# Patient Record
Sex: Female | Born: 1960
Health system: Southern US, Community
[De-identification: ages and names within clinical notes are randomized; demographics above are authoritative.]

## PROBLEM LIST (undated history)

## (undated) DIAGNOSIS — E785 Hyperlipidemia, unspecified: Secondary | ICD-10-CM

## (undated) DIAGNOSIS — S46009A Unspecified injury of muscle(s) and tendon(s) of the rotator cuff of unspecified shoulder, initial encounter: Secondary | ICD-10-CM

## (undated) DIAGNOSIS — F329 Major depressive disorder, single episode, unspecified: Secondary | ICD-10-CM

## (undated) DIAGNOSIS — Z86010 Personal history of colonic polyps: Secondary | ICD-10-CM

## (undated) DIAGNOSIS — E119 Type 2 diabetes mellitus without complications: Secondary | ICD-10-CM

## (undated) DIAGNOSIS — M1990 Unspecified osteoarthritis, unspecified site: Secondary | ICD-10-CM

## (undated) DIAGNOSIS — I10 Essential (primary) hypertension: Secondary | ICD-10-CM

## (undated) DIAGNOSIS — F32A Depression, unspecified: Secondary | ICD-10-CM

## (undated) HISTORY — PX: TOTAL ABDOMINAL HYSTERECTOMY: SHX209

## (undated) HISTORY — PX: COLPOSCOPY: SHX161

## (undated) HISTORY — DX: Hyperlipidemia, unspecified: E78.5

## (undated) HISTORY — DX: Major depressive disorder, single episode, unspecified: F32.9

## (undated) HISTORY — DX: Depression, unspecified: F32.A

## (undated) HISTORY — DX: Unspecified osteoarthritis, unspecified site: M19.90

## (undated) HISTORY — DX: Type 2 diabetes mellitus without complications: E11.9

## (undated) HISTORY — DX: Personal history of colonic polyps: Z86.010

## (undated) HISTORY — DX: Unspecified injury of muscle(s) and tendon(s) of the rotator cuff of unspecified shoulder, initial encounter: S46.009A

## (undated) HISTORY — DX: Essential (primary) hypertension: I10

---

## 1987-07-21 HISTORY — PX: NASAL SEPTUM SURGERY: SHX37

## 1999-05-13 ENCOUNTER — Other Ambulatory Visit: Admission: RE | Admit: 1999-05-13 | Discharge: 1999-05-13 | Payer: Self-pay | Admitting: Obstetrics & Gynecology

## 1999-05-13 ENCOUNTER — Encounter: Admission: RE | Admit: 1999-05-13 | Discharge: 1999-05-13 | Payer: Self-pay | Admitting: Internal Medicine

## 1999-06-27 ENCOUNTER — Encounter: Admission: RE | Admit: 1999-06-27 | Discharge: 1999-06-27 | Payer: Self-pay | Admitting: Obstetrics & Gynecology

## 1999-07-07 ENCOUNTER — Encounter: Payer: Self-pay | Admitting: Emergency Medicine

## 1999-07-07 ENCOUNTER — Emergency Department (HOSPITAL_COMMUNITY): Admission: EM | Admit: 1999-07-07 | Discharge: 1999-07-07 | Payer: Self-pay | Admitting: Emergency Medicine

## 2003-02-02 ENCOUNTER — Encounter: Admission: RE | Admit: 2003-02-02 | Discharge: 2003-02-02 | Payer: Self-pay | Admitting: Internal Medicine

## 2003-05-11 ENCOUNTER — Encounter (INDEPENDENT_AMBULATORY_CARE_PROVIDER_SITE_OTHER): Payer: Self-pay | Admitting: *Deleted

## 2003-05-11 ENCOUNTER — Encounter: Admission: RE | Admit: 2003-05-11 | Discharge: 2003-05-11 | Payer: Self-pay | Admitting: Obstetrics and Gynecology

## 2003-05-17 ENCOUNTER — Other Ambulatory Visit: Admission: RE | Admit: 2003-05-17 | Discharge: 2003-05-17 | Payer: Self-pay | Admitting: Family Medicine

## 2003-05-17 ENCOUNTER — Encounter (INDEPENDENT_AMBULATORY_CARE_PROVIDER_SITE_OTHER): Payer: Self-pay | Admitting: *Deleted

## 2003-05-17 ENCOUNTER — Encounter: Admission: RE | Admit: 2003-05-17 | Discharge: 2003-05-17 | Payer: Self-pay | Admitting: Obstetrics and Gynecology

## 2003-05-21 ENCOUNTER — Ambulatory Visit (HOSPITAL_COMMUNITY): Admission: RE | Admit: 2003-05-21 | Discharge: 2003-05-21 | Payer: Self-pay | Admitting: Obstetrics and Gynecology

## 2003-05-23 ENCOUNTER — Ambulatory Visit (HOSPITAL_COMMUNITY): Admission: RE | Admit: 2003-05-23 | Discharge: 2003-05-23 | Payer: Self-pay | Admitting: *Deleted

## 2003-05-28 ENCOUNTER — Ambulatory Visit (HOSPITAL_COMMUNITY): Admission: RE | Admit: 2003-05-28 | Discharge: 2003-05-28 | Payer: Self-pay | Admitting: Gynecology

## 2003-05-29 ENCOUNTER — Ambulatory Visit: Admission: RE | Admit: 2003-05-29 | Discharge: 2003-05-29 | Payer: Self-pay | Admitting: Gynecology

## 2003-06-08 ENCOUNTER — Encounter (INDEPENDENT_AMBULATORY_CARE_PROVIDER_SITE_OTHER): Payer: Self-pay

## 2003-06-08 ENCOUNTER — Ambulatory Visit (HOSPITAL_COMMUNITY): Admission: RE | Admit: 2003-06-08 | Discharge: 2003-06-08 | Payer: Self-pay | Admitting: Family Medicine

## 2003-06-22 ENCOUNTER — Encounter: Admission: RE | Admit: 2003-06-22 | Discharge: 2003-06-22 | Payer: Self-pay | Admitting: Family Medicine

## 2003-07-12 ENCOUNTER — Encounter: Admission: RE | Admit: 2003-07-12 | Discharge: 2003-07-12 | Payer: Self-pay | Admitting: Obstetrics and Gynecology

## 2003-08-03 ENCOUNTER — Ambulatory Visit: Admission: RE | Admit: 2003-08-03 | Discharge: 2003-08-03 | Payer: Self-pay | Admitting: Family Medicine

## 2003-08-22 ENCOUNTER — Inpatient Hospital Stay (HOSPITAL_COMMUNITY): Admission: RE | Admit: 2003-08-22 | Discharge: 2003-08-26 | Payer: Self-pay | Admitting: Family Medicine

## 2003-08-22 ENCOUNTER — Encounter (INDEPENDENT_AMBULATORY_CARE_PROVIDER_SITE_OTHER): Payer: Self-pay | Admitting: Specialist

## 2003-08-28 ENCOUNTER — Encounter: Admission: RE | Admit: 2003-08-28 | Discharge: 2003-08-28 | Payer: Self-pay | Admitting: Obstetrics and Gynecology

## 2003-09-06 ENCOUNTER — Encounter: Admission: RE | Admit: 2003-09-06 | Discharge: 2003-09-06 | Payer: Self-pay | Admitting: Family Medicine

## 2003-09-13 ENCOUNTER — Encounter: Admission: RE | Admit: 2003-09-13 | Discharge: 2003-09-13 | Payer: Self-pay | Admitting: Family Medicine

## 2003-09-20 ENCOUNTER — Encounter: Admission: RE | Admit: 2003-09-20 | Discharge: 2003-09-20 | Payer: Self-pay | Admitting: Obstetrics and Gynecology

## 2003-09-27 ENCOUNTER — Encounter: Admission: RE | Admit: 2003-09-27 | Discharge: 2003-09-27 | Payer: Self-pay | Admitting: Obstetrics and Gynecology

## 2003-10-04 ENCOUNTER — Encounter: Admission: RE | Admit: 2003-10-04 | Discharge: 2003-10-04 | Payer: Self-pay | Admitting: Family Medicine

## 2003-10-18 ENCOUNTER — Encounter: Admission: RE | Admit: 2003-10-18 | Discharge: 2003-10-18 | Payer: Self-pay | Admitting: Obstetrics and Gynecology

## 2004-05-15 ENCOUNTER — Ambulatory Visit (HOSPITAL_COMMUNITY): Admission: RE | Admit: 2004-05-15 | Discharge: 2004-05-15 | Payer: Self-pay | Admitting: Obstetrics and Gynecology

## 2004-05-15 ENCOUNTER — Ambulatory Visit: Payer: Self-pay | Admitting: Obstetrics and Gynecology

## 2004-05-30 ENCOUNTER — Encounter: Admission: RE | Admit: 2004-05-30 | Discharge: 2004-05-30 | Payer: Self-pay | Admitting: Family Medicine

## 2004-10-23 ENCOUNTER — Ambulatory Visit: Payer: Self-pay | Admitting: Family Medicine

## 2005-07-23 ENCOUNTER — Encounter: Admission: RE | Admit: 2005-07-23 | Discharge: 2005-07-23 | Payer: Self-pay | Admitting: Family Medicine

## 2005-12-23 ENCOUNTER — Encounter: Payer: Self-pay | Admitting: Obstetrics & Gynecology

## 2005-12-23 ENCOUNTER — Ambulatory Visit: Payer: Self-pay | Admitting: Family Medicine

## 2007-01-05 ENCOUNTER — Encounter: Payer: Self-pay | Admitting: Obstetrics & Gynecology

## 2007-01-05 ENCOUNTER — Ambulatory Visit: Payer: Self-pay | Admitting: Obstetrics & Gynecology

## 2007-01-05 ENCOUNTER — Encounter: Admission: RE | Admit: 2007-01-05 | Discharge: 2007-01-05 | Payer: Self-pay | Admitting: Family Medicine

## 2007-05-11 ENCOUNTER — Ambulatory Visit: Payer: Self-pay | Admitting: Family Medicine

## 2007-05-11 DIAGNOSIS — F3289 Other specified depressive episodes: Secondary | ICD-10-CM | POA: Insufficient documentation

## 2007-05-11 DIAGNOSIS — E785 Hyperlipidemia, unspecified: Secondary | ICD-10-CM | POA: Insufficient documentation

## 2007-05-11 DIAGNOSIS — F329 Major depressive disorder, single episode, unspecified: Secondary | ICD-10-CM | POA: Insufficient documentation

## 2007-05-11 DIAGNOSIS — I10 Essential (primary) hypertension: Secondary | ICD-10-CM | POA: Insufficient documentation

## 2007-05-23 ENCOUNTER — Ambulatory Visit: Payer: Self-pay | Admitting: Family Medicine

## 2007-05-23 LAB — CONVERTED CEMR LAB
Bilirubin Urine: NEGATIVE
Blood in Urine, dipstick: NEGATIVE
Glucose, Urine, Semiquant: NEGATIVE
Ketones, urine, test strip: NEGATIVE
Nitrite: NEGATIVE
Protein, U semiquant: NEGATIVE
Specific Gravity, Urine: 1.02
Urobilinogen, UA: 0.2
WBC Urine, dipstick: NEGATIVE
pH: 6

## 2007-05-25 LAB — CONVERTED CEMR LAB
ALT: 33 units/L (ref 0–35)
AST: 24 units/L (ref 0–37)
Albumin: 3.8 g/dL (ref 3.5–5.2)
Alkaline Phosphatase: 55 units/L (ref 39–117)
BUN: 8 mg/dL (ref 6–23)
Basophils Absolute: 0.1 10*3/uL (ref 0.0–0.1)
Basophils Relative: 0.9 % (ref 0.0–1.0)
Bilirubin, Direct: 0.1 mg/dL (ref 0.0–0.3)
CO2: 25 meq/L (ref 19–32)
Calcium: 9.4 mg/dL (ref 8.4–10.5)
Chloride: 110 meq/L (ref 96–112)
Cholesterol: 276 mg/dL (ref 0–200)
Creatinine, Ser: 0.7 mg/dL (ref 0.4–1.2)
Direct LDL: 205.2 mg/dL
Eosinophils Absolute: 0.2 10*3/uL (ref 0.0–0.6)
Eosinophils Relative: 3.3 % (ref 0.0–5.0)
GFR calc Af Amer: 115 mL/min
GFR calc non Af Amer: 95 mL/min
Glucose, Bld: 98 mg/dL (ref 70–99)
HCT: 41.3 % (ref 36.0–46.0)
HDL: 39.4 mg/dL (ref >39.0)
Hemoglobin: 14.8 g/dL (ref 12.0–15.0)
Lymphocytes Relative: 33.8 % (ref 12.0–46.0)
MCHC: 35.8 g/dL (ref 30.0–36.0)
MCV: 88.5 fL (ref 78.0–100.0)
Monocytes Absolute: 0.4 10*3/uL (ref 0.2–0.7)
Monocytes Relative: 6.2 % (ref 3.0–11.0)
Neutro Abs: 4 10*3/uL (ref 1.4–7.7)
Neutrophils Relative %: 55.8 % (ref 43.0–77.0)
Platelets: 275 10*3/uL (ref 150–400)
Potassium: 4.3 meq/L (ref 3.5–5.1)
RBC: 4.67 M/uL (ref 3.87–5.11)
RDW: 12.2 % (ref 11.5–14.6)
Sodium: 143 meq/L (ref 135–145)
TSH: 2.24 microintl units/mL (ref 0.35–5.50)
Total Bilirubin: 0.7 mg/dL (ref 0.3–1.2)
Total CHOL/HDL Ratio: 7
Total Protein: 6.6 g/dL (ref 6.0–8.3)
Triglycerides: 228 mg/dL (ref 0–149)
VLDL: 46 mg/dL — ABNORMAL HIGH (ref 0–40)
WBC: 7.1 10*3/uL (ref 4.5–10.5)

## 2007-05-27 ENCOUNTER — Telehealth: Payer: Self-pay | Admitting: Family Medicine

## 2007-08-12 ENCOUNTER — Ambulatory Visit: Payer: Self-pay | Admitting: Family Medicine

## 2007-08-12 ENCOUNTER — Telehealth: Payer: Self-pay | Admitting: Family Medicine

## 2007-08-15 ENCOUNTER — Telehealth: Payer: Self-pay | Admitting: Family Medicine

## 2007-08-15 LAB — CONVERTED CEMR LAB
ALT: 36 units/L — ABNORMAL HIGH (ref 0–35)
AST: 24 units/L (ref 0–37)
Albumin: 3.9 g/dL (ref 3.5–5.2)
Alkaline Phosphatase: 57 units/L (ref 39–117)
Bilirubin, Direct: 0.1 mg/dL (ref 0.0–0.3)
Cholesterol: 177 mg/dL (ref 0–200)
HDL: 41.2 mg/dL (ref >39.0)
LDL Cholesterol: 114 mg/dL — ABNORMAL HIGH (ref 0–99)
Total Bilirubin: 0.8 mg/dL (ref 0.3–1.2)
Total CHOL/HDL Ratio: 4.3
Total Protein: 7.1 g/dL (ref 6.0–8.3)
Triglycerides: 111 mg/dL (ref 0–149)
VLDL: 22 mg/dL (ref 0–40)

## 2007-10-12 ENCOUNTER — Telehealth: Payer: Self-pay | Admitting: Family Medicine

## 2007-11-30 ENCOUNTER — Telehealth: Payer: Self-pay | Admitting: Family Medicine

## 2007-12-15 ENCOUNTER — Encounter: Payer: Self-pay | Admitting: Family Medicine

## 2007-12-21 ENCOUNTER — Encounter: Admission: RE | Admit: 2007-12-21 | Discharge: 2007-12-21 | Payer: Self-pay | Admitting: Family Medicine

## 2008-03-13 ENCOUNTER — Encounter: Payer: Self-pay | Admitting: Family Medicine

## 2008-03-29 ENCOUNTER — Telehealth: Payer: Self-pay | Admitting: Family Medicine

## 2008-04-23 ENCOUNTER — Ambulatory Visit: Payer: Self-pay | Admitting: Family Medicine

## 2008-04-23 ENCOUNTER — Encounter: Admission: RE | Admit: 2008-04-23 | Discharge: 2008-04-23 | Payer: Self-pay | Admitting: Family Medicine

## 2008-05-09 ENCOUNTER — Encounter: Payer: Self-pay | Admitting: Family Medicine

## 2008-05-23 ENCOUNTER — Encounter: Payer: Self-pay | Admitting: Family Medicine

## 2008-05-30 ENCOUNTER — Encounter: Payer: Self-pay | Admitting: Family Medicine

## 2008-06-15 ENCOUNTER — Telehealth: Payer: Self-pay | Admitting: Family Medicine

## 2008-10-05 ENCOUNTER — Telehealth: Payer: Self-pay | Admitting: Family Medicine

## 2009-05-06 ENCOUNTER — Telehealth: Payer: Self-pay | Admitting: Family Medicine

## 2009-05-10 ENCOUNTER — Ambulatory Visit: Payer: Self-pay | Admitting: Family Medicine

## 2009-05-10 DIAGNOSIS — M1990 Unspecified osteoarthritis, unspecified site: Secondary | ICD-10-CM | POA: Insufficient documentation

## 2009-05-10 DIAGNOSIS — J309 Allergic rhinitis, unspecified: Secondary | ICD-10-CM | POA: Insufficient documentation

## 2009-05-10 LAB — CONVERTED CEMR LAB
Bilirubin Urine: NEGATIVE
Blood in Urine, dipstick: NEGATIVE
Glucose, Urine, Semiquant: NEGATIVE
Ketones, urine, test strip: NEGATIVE
Nitrite: POSITIVE
Protein, U semiquant: NEGATIVE
Specific Gravity, Urine: 1.02
Urobilinogen, UA: 0.2
WBC Urine, dipstick: NEGATIVE
pH: 5

## 2009-05-11 ENCOUNTER — Encounter: Payer: Self-pay | Admitting: Family Medicine

## 2009-05-13 LAB — CONVERTED CEMR LAB
ALT: 69 units/L — ABNORMAL HIGH (ref 0–35)
AST: 46 units/L — ABNORMAL HIGH (ref 0–37)
Albumin: 3.9 g/dL (ref 3.5–5.2)
Alkaline Phosphatase: 57 units/L (ref 39–117)
BUN: 9 mg/dL (ref 6–23)
Basophils Absolute: 0.1 10*3/uL (ref 0.0–0.1)
Basophils Relative: 0.8 % (ref 0.0–3.0)
Bilirubin, Direct: 0.1 mg/dL (ref 0.0–0.3)
CO2: 28 meq/L (ref 19–32)
Calcium: 8.9 mg/dL (ref 8.4–10.5)
Chloride: 106 meq/L (ref 96–112)
Cholesterol: 193 mg/dL (ref 0–200)
Creatinine, Ser: 0.7 mg/dL (ref 0.4–1.2)
Eosinophils Absolute: 0.2 10*3/uL (ref 0.0–0.7)
Eosinophils Relative: 3.2 % (ref 0.0–5.0)
GFR calc non Af Amer: 94.01 mL/min (ref >60)
Glucose, Bld: 93 mg/dL (ref 70–99)
HCT: 41.5 % (ref 36.0–46.0)
HDL: 40 mg/dL (ref >39.00)
Hemoglobin: 14.5 g/dL (ref 12.0–15.0)
LDL Cholesterol: 126 mg/dL — ABNORMAL HIGH (ref 0–99)
Lymphocytes Relative: 31 % (ref 12.0–46.0)
Lymphs Abs: 2 10*3/uL (ref 0.7–4.0)
MCHC: 35 g/dL (ref 30.0–36.0)
MCV: 91.3 fL (ref 78.0–100.0)
Monocytes Absolute: 0.5 10*3/uL (ref 0.1–1.0)
Monocytes Relative: 7.4 % (ref 3.0–12.0)
Neutro Abs: 3.7 10*3/uL (ref 1.4–7.7)
Neutrophils Relative %: 57.6 % (ref 43.0–77.0)
Platelets: 203 10*3/uL (ref 150.0–400.0)
Potassium: 3.8 meq/L (ref 3.5–5.1)
RBC: 4.55 M/uL (ref 3.87–5.11)
RDW: 12.5 % (ref 11.5–14.6)
Sodium: 144 meq/L (ref 135–145)
TSH: 1.74 microintl units/mL (ref 0.35–5.50)
Total Bilirubin: 0.9 mg/dL (ref 0.3–1.2)
Total CHOL/HDL Ratio: 5
Total Protein: 7 g/dL (ref 6.0–8.3)
Triglycerides: 133 mg/dL (ref 0.0–149.0)
VLDL: 26.6 mg/dL (ref 0.0–40.0)
WBC: 6.5 10*3/uL (ref 4.5–10.5)

## 2009-06-12 ENCOUNTER — Telehealth: Payer: Self-pay | Admitting: Family Medicine

## 2009-12-06 ENCOUNTER — Telehealth: Payer: Self-pay | Admitting: Family Medicine

## 2010-04-11 ENCOUNTER — Telehealth: Payer: Self-pay | Admitting: Family Medicine

## 2010-08-17 LAB — CONVERTED CEMR LAB
ALT: 37 units/L — ABNORMAL HIGH (ref 0–35)
AST: 28 units/L (ref 0–37)
Albumin: 3.6 g/dL (ref 3.5–5.2)
Alkaline Phosphatase: 58 units/L (ref 39–117)
BUN: 11 mg/dL (ref 6–23)
Basophils Absolute: 0.1 10*3/uL (ref 0.0–0.1)
Basophils Relative: 0.8 % (ref 0.0–3.0)
Bilirubin Urine: NEGATIVE
Bilirubin, Direct: 0.1 mg/dL (ref 0.0–0.3)
Blood in Urine, dipstick: NEGATIVE
CO2: 25 meq/L (ref 19–32)
Calcium: 8.7 mg/dL (ref 8.4–10.5)
Chloride: 108 meq/L (ref 96–112)
Cholesterol: 194 mg/dL (ref 0–200)
Creatinine, Ser: 0.6 mg/dL (ref 0.4–1.2)
Eosinophils Absolute: 0.2 10*3/uL (ref 0.0–0.7)
Eosinophils Relative: 2.7 % (ref 0.0–5.0)
GFR calc Af Amer: 137 mL/min
GFR calc non Af Amer: 113 mL/min
Glucose, Bld: 106 mg/dL — ABNORMAL HIGH (ref 70–99)
Glucose, Urine, Semiquant: NEGATIVE
HCT: 39.8 % (ref 36.0–46.0)
HDL: 41.7 mg/dL (ref >39.0)
Hemoglobin: 13.8 g/dL (ref 12.0–15.0)
Ketones, urine, test strip: NEGATIVE
LDL Cholesterol: 131 mg/dL — ABNORMAL HIGH (ref 0–99)
Lymphocytes Relative: 28.2 % (ref 12.0–46.0)
MCHC: 34.6 g/dL (ref 30.0–36.0)
MCV: 90.1 fL (ref 78.0–100.0)
Monocytes Absolute: 0.4 10*3/uL (ref 0.1–1.0)
Monocytes Relative: 6.5 % (ref 3.0–12.0)
Neutro Abs: 4.2 10*3/uL (ref 1.4–7.7)
Neutrophils Relative %: 61.8 % (ref 43.0–77.0)
Nitrite: NEGATIVE
Platelets: 227 10*3/uL (ref 150–400)
Potassium: 3.7 meq/L (ref 3.5–5.1)
Protein, U semiquant: NEGATIVE
RBC: 4.42 M/uL (ref 3.87–5.11)
RDW: 12.4 % (ref 11.5–14.6)
Sodium: 142 meq/L (ref 135–145)
Specific Gravity, Urine: 1.025
TSH: 1.41 microintl units/mL (ref 0.35–5.50)
Total Bilirubin: 0.7 mg/dL (ref 0.3–1.2)
Total CHOL/HDL Ratio: 4.7
Total Protein: 6.4 g/dL (ref 6.0–8.3)
Triglycerides: 105 mg/dL (ref 0–149)
Urobilinogen, UA: 0.2
VLDL: 21 mg/dL (ref 0–40)
WBC Urine, dipstick: NEGATIVE
WBC: 6.8 10*3/uL (ref 4.5–10.5)
pH: 6

## 2010-08-21 ENCOUNTER — Other Ambulatory Visit: Payer: Self-pay

## 2010-08-21 DIAGNOSIS — I10 Essential (primary) hypertension: Secondary | ICD-10-CM

## 2010-08-21 DIAGNOSIS — E785 Hyperlipidemia, unspecified: Secondary | ICD-10-CM

## 2010-08-21 NOTE — Progress Notes (Signed)
Summary: Ibuprofen  Phone Note Call from Patient   Caller: Patient Call For: Nelwyn Salisbury MD Summary of Call: Pt. takes Ibuprofen 200 mg 4 as needed, but took a 800 mg. from a friend, and worked much better.  Would like a prescription sent to [facility]  [facility]......[employer] [phone] Initial call taken by: Lynann Beaver CMA,  Dec 06, 2009 1:45 PM  Follow-up for Phone Call        call in Motrin 800 mg q 6 hours as needed pain, #60 with 5 rf Follow-up by: Nelwyn Salisbury MD,  Dec 06, 2009 3:09 PM    New/Updated Medications: IBUPROFEN 800 MG TABS (IBUPROFEN) one by mouth q 6-8 hrs as needed pain Prescriptions: IBUPROFEN 800 MG TABS (IBUPROFEN) one by mouth q 6-8 hrs as needed pain  #60 x 5   Entered by:   Lynann Beaver CMA   Authorized by:   Nelwyn Salisbury MD   Signed by:   Lynann Beaver CMA on 12/06/2009   Method used:   Electronically to        [facility] [address] [phone]* (retail)       [address]       [place], [place]  [address]       Ph: [phone]       Fax: [phone]   RxID:   [id number]

## 2010-08-21 NOTE — Progress Notes (Signed)
Summary: skin tags  Phone Note Call from Patient Call back at Home Phone [phone]   Caller: Patient Call For: Nelwyn Salisbury MD Summary of Call: Pt wants to know if there is a RX to destroy skin tags. Initial call taken by: Lynann Beaver CMA,  April 11, 2010 3:20 PM  Follow-up for Phone Call        sometimes this works for very small tags but not larger ones. I would need to see her to evaluate these  Follow-up by: Nelwyn Salisbury MD,  April 11, 2010 3:33 PM  Additional Follow-up for Phone Call Additional follow up Details #1::        Notified pt. Additional Follow-up by: Lynann Beaver CMA,  April 11, 2010 3:36 PM

## 2010-08-22 MED ORDER — LISINOPRIL-HYDROCHLOROTHIAZIDE 10-12.5 MG PO TABS: 1.0000 | ORAL_TABLET | Freq: Every day | ORAL | Status: DC

## 2010-08-22 MED ORDER — SIMVASTATIN 40 MG PO TABS: 40.0000 mg | ORAL_TABLET | Freq: Every evening | ORAL | Status: DC

## 2010-08-22 NOTE — Telephone Encounter (Signed)
Call in one month of each with no rf. Needs an OV soon

## 2010-08-29 ENCOUNTER — Telehealth: Payer: Self-pay | Admitting: *Deleted

## 2010-08-29 NOTE — Telephone Encounter (Signed)
Faxed refill request came to Dr Caryl Never, it's a Dr Clent Ridges pt.  Routing to [place], faxed request in Gina's basket

## 2010-09-01 ENCOUNTER — Other Ambulatory Visit: Payer: Self-pay | Admitting: Family Medicine

## 2010-09-01 NOTE — Telephone Encounter (Signed)
Teresa Sine,  Do you know what the medication is ?

## 2010-09-01 NOTE — Telephone Encounter (Signed)
Left mess on vm that appt needed before refill

## 2010-09-29 ENCOUNTER — Encounter: Payer: Self-pay | Admitting: Family Medicine

## 2010-09-30 ENCOUNTER — Encounter: Payer: Self-pay | Admitting: Family Medicine

## 2010-09-30 ENCOUNTER — Ambulatory Visit (INDEPENDENT_AMBULATORY_CARE_PROVIDER_SITE_OTHER): Payer: Self-pay | Admitting: Family Medicine

## 2010-09-30 VITALS — BP 120/80 | HR 116 | Temp 98.4°F

## 2010-09-30 DIAGNOSIS — F329 Major depressive disorder, single episode, unspecified: Secondary | ICD-10-CM

## 2010-09-30 DIAGNOSIS — I10 Essential (primary) hypertension: Secondary | ICD-10-CM

## 2010-09-30 DIAGNOSIS — F32A Depression, unspecified: Secondary | ICD-10-CM

## 2010-09-30 DIAGNOSIS — E785 Hyperlipidemia, unspecified: Secondary | ICD-10-CM

## 2010-09-30 MED ORDER — SIMVASTATIN 40 MG PO TABS: 40.0000 mg | ORAL_TABLET | Freq: Every day | ORAL | Status: DC

## 2010-09-30 MED ORDER — LISINOPRIL-HYDROCHLOROTHIAZIDE 10-12.5 MG PO TABS: 1.0000 | ORAL_TABLET | Freq: Every day | ORAL | Status: DC

## 2010-09-30 MED ORDER — IBUPROFEN 800 MG PO TABS: 800.0000 mg | ORAL_TABLET | Freq: Four times a day (QID) | ORAL | Status: DC | PRN

## 2010-09-30 NOTE — Progress Notes (Signed)
  Subjective:    Patient ID: Teresa Dawson, female    DOB: 03-Jun-1961, 50 y.o.   MRN: [id number]  HPI Here to follow up on depression, lipids, and HTN. She feels well and has no concerns. She is not fasting today.She has not had a cpx for several years.   Review of Systems  Constitutional: Negative.   Respiratory: Negative.   Cardiovascular: Negative.   Psychiatric/Behavioral: Negative.        Objective:   Physical Exam  Constitutional: She appears well-developed and well-nourished.  Neck: No thyromegaly present.  Cardiovascular: Normal rate, regular rhythm, normal heart sounds and intact distal pulses.   Pulmonary/Chest: Effort normal and breath sounds normal.  Lymphadenopathy:    She has no cervical adenopathy.  Psychiatric: She has a normal mood and affect. Her behavior is normal. Judgment and thought content normal.          Assessment & Plan:  She seems to be doing well in general. I refilled her meds. She will set up fasting labs and a cpx soon.

## 2010-11-04 ENCOUNTER — Other Ambulatory Visit (INDEPENDENT_AMBULATORY_CARE_PROVIDER_SITE_OTHER): Payer: Self-pay | Admitting: Family Medicine

## 2010-11-04 DIAGNOSIS — Z0000 Encounter for general adult medical examination without abnormal findings: Secondary | ICD-10-CM

## 2010-11-04 LAB — BASIC METABOLIC PANEL
BUN: 13 mg/dL (ref 6–23)
CO2: 24 mEq/L (ref 19–32)
Calcium: 9.4 mg/dL (ref 8.4–10.5)
Chloride: 105 mEq/L (ref 96–112)
Creatinine, Ser: 0.7 mg/dL (ref 0.4–1.2)
GFR: 90.46 mL/min (ref >60.00)
Glucose, Bld: 107 mg/dL — ABNORMAL HIGH (ref 70–99)
Potassium: 4 mEq/L (ref 3.5–5.1)
Sodium: 139 mEq/L (ref 135–145)

## 2010-11-04 LAB — POCT URINALYSIS DIPSTICK
Bilirubin, UA: NEGATIVE
Blood, UA: NEGATIVE
Ketones, UA: NEGATIVE
Leukocytes, UA: NEGATIVE
pH, UA: 5

## 2010-11-04 LAB — CBC WITH DIFFERENTIAL/PLATELET
Basophils Absolute: 0 10*3/uL (ref 0.0–0.1)
Eosinophils Absolute: 0.2 10*3/uL (ref 0.0–0.7)
HCT: 40.3 % (ref 36.0–46.0)
Hemoglobin: 14 g/dL (ref 12.0–15.0)
Lymphs Abs: 2.2 10*3/uL (ref 0.7–4.0)
MCHC: 34.8 g/dL (ref 30.0–36.0)
Monocytes Absolute: 0.5 10*3/uL (ref 0.1–1.0)
Neutro Abs: 4.6 10*3/uL (ref 1.4–7.7)
RDW: 13.1 % (ref 11.5–14.6)

## 2010-11-04 LAB — LIPID PANEL
Cholesterol: 181 mg/dL (ref 0–200)
HDL: 41 mg/dL (ref >39.00)
VLDL: 22.4 mg/dL (ref 0.0–40.0)

## 2010-11-04 LAB — HEPATIC FUNCTION PANEL: Albumin: 3.8 g/dL (ref 3.5–5.2)

## 2010-11-06 ENCOUNTER — Telehealth: Payer: Self-pay

## 2010-11-06 NOTE — Telephone Encounter (Signed)
Pt aware.

## 2010-11-06 NOTE — Telephone Encounter (Signed)
Message copied by Madison Hickman on Thu Nov 06, 2010 10:11 AM ------      Message from: Dwaine Deter      Created: Thu Nov 06, 2010  8:19 AM       normal

## 2010-11-07 ENCOUNTER — Other Ambulatory Visit: Payer: Self-pay | Admitting: Family Medicine

## 2010-11-07 DIAGNOSIS — Z1231 Encounter for screening mammogram for malignant neoplasm of breast: Secondary | ICD-10-CM

## 2010-11-11 ENCOUNTER — Encounter: Payer: Self-pay | Admitting: Family Medicine

## 2010-11-11 ENCOUNTER — Ambulatory Visit
Admission: RE | Admit: 2010-11-11 | Discharge: 2010-11-11 | Disposition: A | Discharge status: Home / Self Care | Payer: Self-pay | Source: Ambulatory Visit | Attending: Family Medicine | Admitting: Family Medicine

## 2010-11-11 DIAGNOSIS — Z1231 Encounter for screening mammogram for malignant neoplasm of breast: Secondary | ICD-10-CM

## 2010-11-18 ENCOUNTER — Encounter: Payer: Self-pay | Admitting: Family Medicine

## 2010-11-18 ENCOUNTER — Ambulatory Visit: Payer: Self-pay | Admitting: Family Medicine

## 2010-11-18 ENCOUNTER — Ambulatory Visit (INDEPENDENT_AMBULATORY_CARE_PROVIDER_SITE_OTHER): Payer: Self-pay | Admitting: Family Medicine

## 2010-11-18 VITALS — BP 132/78 | HR 107 | Temp 98.2°F | Resp 18 | Ht 63.5 in | Wt 260.0 lb

## 2010-11-18 DIAGNOSIS — Z136 Encounter for screening for cardiovascular disorders: Secondary | ICD-10-CM

## 2010-11-18 DIAGNOSIS — Z0000 Encounter for general adult medical examination without abnormal findings: Secondary | ICD-10-CM

## 2010-11-18 MED ORDER — FLUTICASONE PROPIONATE 50 MCG/ACT NA SUSP: 2.0000 | Freq: Every day | NASAL | Status: DC

## 2010-11-18 NOTE — Progress Notes (Signed)
  Subjective:    Patient ID: Teresa Dawson, female    DOB: Dec 01, 1960, 50 y.o.   MRN: [id number]  HPI 50 yr old female for a cpx. She feels well. She knows she is overweight and gets very little exercise.    Review of Systems  Constitutional: Negative.   HENT: Negative.   Eyes: Negative.   Respiratory: Negative.   Cardiovascular: Negative.   Gastrointestinal: Negative.   Genitourinary: Negative for dysuria, urgency, frequency, hematuria, flank pain, decreased urine volume, enuresis, difficulty urinating, pelvic pain and dyspareunia.  Musculoskeletal: Negative.   Skin: Negative.   Neurological: Negative.   Hematological: Negative.   Psychiatric/Behavioral: Negative.        Objective:   Physical Exam  Constitutional: She is oriented to person, place, and time. She appears well-developed and well-nourished. No distress.  HENT:  Head: Normocephalic and atraumatic.  Right Ear: External ear normal.  Left Ear: External ear normal.  Nose: Nose normal.  Mouth/Throat: Oropharynx is clear and moist. No oropharyngeal exudate.  Eyes: Conjunctivae and EOM are normal. Pupils are equal, round, and reactive to light. No scleral icterus.  Neck: Normal range of motion. Neck supple. No JVD present. No thyromegaly present.  Cardiovascular: Normal rate, regular rhythm, normal heart sounds and intact distal pulses.  Exam reveals no gallop and no friction rub.   No murmur heard.      EKG normal   Pulmonary/Chest: Effort normal and breath sounds normal. No respiratory distress. She has no wheezes. She has no rales. She exhibits no tenderness.  Abdominal: Soft. Bowel sounds are normal. She exhibits no distension and no mass. There is no tenderness. There is no rebound and no guarding.  Musculoskeletal: Normal range of motion. She exhibits no edema and no tenderness.  Lymphadenopathy:    She has no cervical adenopathy.  Neurological: She is alert and oriented to person, place, and time. She has normal  reflexes. No cranial nerve deficit. She exhibits normal muscle tone. Coordination normal.  Skin: Skin is warm and dry. No rash noted. No erythema.  Psychiatric: She has a normal mood and affect. Her behavior is normal. Judgment and thought content normal.          Assessment & Plan:  Get more exercise and lose weight.

## 2010-11-27 ENCOUNTER — Ambulatory Visit (INDEPENDENT_AMBULATORY_CARE_PROVIDER_SITE_OTHER): Payer: Self-pay | Admitting: Obstetrics and Gynecology

## 2010-11-27 ENCOUNTER — Other Ambulatory Visit: Payer: Self-pay | Admitting: Obstetrics and Gynecology

## 2010-11-27 DIAGNOSIS — N951 Menopausal and female climacteric states: Secondary | ICD-10-CM

## 2010-11-27 DIAGNOSIS — Z01419 Encounter for gynecological examination (general) (routine) without abnormal findings: Secondary | ICD-10-CM

## 2010-11-28 NOTE — Group Therapy Note (Signed)
NAME:  Teresa Dawson, Teresa Dawson NO.:  [id number]  MEDICAL RECORD NO.:  [id number]           PATIENT TYPE:  A  LOCATION:  [facility]                   FACILITY:  [facility]  PHYSICIAN:  Argentina Donovan, MD        DATE OF BIRTH:  May 17, 1961  DATE OF SERVICE:  11/27/2010                                 CLINIC NOTE  CHIEF COMPLAINT:  Here for annual exam and talk about hormone replacement therapy.  HISTORY OF PRESENT ILLNESS:  The patient is a 50 year old nulliparous white female here for annual exam.  She reports that she has had menopausal symptoms since 2005 when she had a hysterectomy for fibroids. She also reports having a history of several abnormal Pap smears in the past, has had colposcopy what she believes as a cone or LEEP procedure, said that was reportedly done in this hospital in 2005.  As far as hormone replacement is concerned, she reports that she had hot flashes from 2005 up until now, but currently has not had any hot flashes in the year.  Also of note, as far as her hysterectomy is concerned, she does report that one ovary was left.  Her main concern today is vaginal dryness.  She does have pain with intercourse especially initiation of intercourse.  She has tried progesterone cream that she is ordered online, but she very much desires to be on hormone replacement therapy today.  Specifically appeal for hormone replacement.  She also does report a decrease in libido especially over the past year.  She has a primary care physician, but wanted her to be seen by Gynecology before initiating hormone replacement therapy.  The patient believes that her last Pap smear was probably around 3-4 years ago.  PAST MEDICAL HISTORY: 1. Hypertension. 2. Hyperlipidemia. 3. Osteoarthritis all are well controlled.  PAST SURGICAL HISTORY:  She had a hysterectomy for history of fibroids. She also reports that she thinks she has a cone procedure.  FAMILY HISTORY:  No history of  breast cancer.  No history of colon cancer or other gynecological cancers.  GYNECOLOGICAL HISTORY:  She has had a history of several abnormal Pap smears.  She also has had a history of Chlamydia before, but no other sexually transmitted diseases.  She has tested positive for HPV in the past and had a colposcopy and she was started __________ cone or a LEEP.  SOCIAL HISTORY:  Denies any tobacco, alcohol, or drug use.  She is a caretaker for an elderly lady.  OB HISTORY:  None.  MEDICATIONS: 1. Simvastatin 40 mg daily. 2. Lisinopril 10 mg daily. 3. Ibuprofen p.r.n.  ALLERGIES:  She is allergic to SULFA.  REVIEW OF SYSTEMS:  Negative.  PHYSICAL EXAMINATION:  VITAL SIGNS:  Temperature 97.1, pulse 103, blood pressure 137/86, weight 263 pounds, height 64 inches. GENERAL:  She is a pleasant appearing female, obese, in no acute distress. HEAD:  Atraumatic and normocephalic. HEART:  Regular rate and rhythm.  No murmurs, rubs, or gallops. LUNGS:  Clear to auscultation bilaterally.  No wheezes, rhonchi, or rales. ABDOMEN:  Obese, nondistended.  Bowel sounds x4.  Nontender. EXTREMITIES:  No  cyanosis, clubbing, or edema. BREASTS:  Normal.  No lumps, tenderness bilaterally. VAGINAL:  External vaginal exam was normal except for several skin tags around her groin and vaginal area that do not appear to be consistent with wart, more of skin tags.  Vaginal exam, no discharge noted.  No lesions were noticed in the vagina.  There was one small erythematous area in her posterior fornix.  Pap smear was obtained of her vaginal wall, specifically of the mild, small irritated area.  Bimanual exam was normal.  No adnexal tenderness due to her size.  I was not able to palpate her remaining right ovary.  ASSESSMENT AND PLAN: 1. Annual exam, we were able to obtain her older records from her     hysterectomy in 2005 that showed her cervix had severe squamous     dysplasia in situ and uterus with  proliferative endometrium with     complex hyperplasia without atypia and also noted to have fibroids     and a benign left ovary.  Due to her history, I did obtain a Pap     smear of her vaginal wall, a breast exam was also performed.  Of     note, she did recently have a mammogram in April which was normal     and those records were reviewed. 2. Atrophic vaginitis and menopausal symptoms.  The patient was     counseled about the risks and benefits of hormone replacement     therapy due to consultation about the small increase in instance of     breast cancer, also increased risk of DVT as well as worsening of     hypertension as well as the other risk of hormone replacement     therapy.  The patient voiced understanding of this plan and after     counselling, she is not sure, she wants to take the oral medicine,     but does want a prescription if she wants to start taking this.     So, I gave her a prescription of estradiol 2 mg p.o. tablet daily     with instructions that she would need to follow up with her primary     care doctor and to make sure that she does not have any worsening     of her hypertension.  I also gave her a prescription of Estrace     cream as well that she would use specifically for her atrophic     vaginitis symptoms.  The patient voiced understanding of plan and     will be notified with results of the Pap smear.          ______________________________ Argentina Donovan, MD    PR/MEDQ  D:  11/27/2010  T:  11/28/2010  Job:  [id number]

## 2010-12-02 NOTE — Group Therapy Note (Signed)
NAME:  Teresa Dawson, Teresa Dawson NO.:  [id number]   MEDICAL RECORD NO.:  [id number]          PATIENT TYPE:  WOC   LOCATION:  [facility]                   FACILITY:  [facility]   PHYSICIAN:  Elsie Lincoln, MD      DATE OF BIRTH:  1961-05-30   DATE OF SERVICE:                                  CLINIC NOTE   The patient is a 50 year old female who presents for a yearly exam.  Dr.  Shawnie Pons, myself, and Dr. Serita Kyle did a TAH LSO for carcinoma in  situ with positive margins. She has had Pap smears twice a year, then  yearly since then, and they have all been normal. She presents today for  a Pap smear.  She is also having her mammogram today.   PAST MEDICAL HISTORY:  Plantar fasciitis, new.   PAST SURGICAL HISTORY:  No new surgeries.   ALLERGIES:  None.   MEDICATIONS:  Relafen.   REVIEW OF SYSTEMS:  Negative.  Still having some swelling in her  extremities.  She was on hydrochlorothiazide; however I would like for  her to go to see a primary care physician to make sure there is not  another reason why she is having swelling in her lower extremities;  i.e., venous disease or heart disease.   PHYSICAL EXAMINATION:  VITAL SIGNS:  Temperature 97.5, pulse 90, blood  pressure 134/82, weight 219 pounds, height 5 foot 5 inches.  GENERAL:  She is well developed, well nourished, in no apparent  distress.  HEENT:  Normocephalic atraumatic.  THYROID:  No masses.  LUNGS:  Clear to auscultation bilaterally.  HEART:  Regular rate and rhythm.  BREASTS:  No masses.  No lymphadenopathy or nipple discharge.  ABDOMEN:  Obese, nontender.  No organomegaly.  No hernia. Well-healed  vertical skin incision from hysterectomy.  GENITALIA:  Tanner 5.  Vagina pink.  Normal rugae.  Vaginal vault  intact.  No evidence of any cervix leftover.  Bimanual exam no masses  but difficult secondary to habitus.  EXTREMITIES:  Nontender.  Slight edema.   ASSESSMENT AND PLAN:  [age]-year-old female  status post  hysterectomy for dysplasia of the cervix.   1. Pap smear.  2. Mammogram today.  3. Patient to follow up with [facility] or [facility], or Dr. Shawnie Pons in her      family practice clinic to start primary care.  4. Return to clinic in 1 year.           ______________________________  Elsie Lincoln, MD     KL/MEDQ  D:  01/05/2007  T:  01/06/2007  Job:  [id number]

## 2010-12-05 NOTE — Consult Note (Signed)
NAME:  Teresa Dawson, Teresa Dawson NO.:  [id number]   MEDICAL RECORD NO.:  [id number]                   PATIENT TYPE:  OUT   LOCATION:  GYN                                  FACILITY:  [facility]   PHYSICIAN:  De Blanch, M.D.         DATE OF BIRTH:  May 15, 1961   DATE OF CONSULTATION:  05/29/2003  DATE OF DISCHARGE:                                   CONSULTATION   HISTORY OF PRESENT ILLNESS:  A 50 year old white female seen in consultation  at the request of Dr. Tinnie Gens of the [facility].  The patient has recently had a Pap smear suggestive of squamous cell  carcinoma, and subsequently underwent colposcopy with directed biopsies  showing carcinoma in situ.  Cannot rule out microinvasion.  On pelvic  exam, a paracervical mass was noted, and the patient subsequently had a CT  scan and an ultrasound which showed a large soft tissue mass contiguous of  the lower uterine segment, consistent with uterine fibroids.  This measured  approximately 14 x 13 x 13 cm.  Other smaller fibroids are also noted.  There is no evidence of adenopathy or ascites.   The patient reports that she has a history of abnormal Pap smears in the  year 2000, but has not had a Pap smear for the last three or four years.   PAST MEDICAL HISTORY:   MEDICAL ILLNESSES:  1. Hypertension.  2. Obesity.  3. Elevated cholesterol.   PAST SURGICAL HISTORY:  Correction of deviated nasal septum in 1989.   CURRENT MEDICATIONS:  None.   DRUG ALLERGIES:  Sulfa.   GYNECOLOGIC HISTORY:  Negative, except as noted above.   OBSTETRICAL HISTORY:  Gravida 0.   FAMILY HISTORY:  Negative for gynecologic, breast, or colon cancers.   SOCIAL HISTORY:  The patient does not smoke.  She is unmarried.  Lives with  a roommate.   REVIEW OF SYSTEMS:  Negative for cardiovascular, pulmonary, GI, GU,  musculoskeletal, or neurologic symptoms.   PHYSICAL EXAMINATION:  VITAL SIGNS:   Height 5'5, weight 219 pounds, blood  pressure 138/102, pulse 96, respiratory rate 18.  GENERAL:  The patient is a healthy white obese female in no acute distress.  HEENT:  Negative.  NECK:  Supple without thyromegaly.  ABDOMEN:  Obese, soft, nontender.  There is a mass palpable nearly to the  umbilicus, especially on the right side.  This is nontender.  PELVIC:  EGBUS, vagina, bladder, urethra are normal.  The cervix is  difficult to visualize.  It is deviated to the right.  There is a  paracervical mass to the left of the cervical os measuring approximately 5-6  cm in diameter.  Bimanual exam reveals approximately 20 week size irregular  uterus.  The cervix itself feels normal.  RECTOVAGINAL EXAM:  Confirms.   IMPRESSION:  Carcinoma in situ of the cervix with the Pap smear suggestive  of  invasive carcinoma.  I would recommend that the patient undergo a cold  knife conization and endocervical curettage to fully establish a true  diagnosis, and thereby be able to guide appropriate therapy.  I have  contacted Dr. Shawnie Pons, and she will arrange to have the cold knife conization  performed in the near future.  We will await the reports from the  pathologist on this before making further recommendations.  The rationale  for this approach was discussed with the patient.  She understands, and all  of her questions were answered.                                               De Blanch, M.D.    DC/MEDQ  D:  05/29/2003  T:  05/29/2003  Job:  [id number]   cc:   Shelbie Proctor. Shawnie Pons, M.D.  Fax: [phone]   Telford Nab, R.N.  [address]  [place], [place] [address]

## 2010-12-05 NOTE — Op Note (Signed)
NAME:  Teresa Dawson, Teresa Dawson                            ACCOUNT NO.:  [id number]   MEDICAL RECORD NO.:  [id number]                   PATIENT TYPE:  AMB   LOCATION:  [facility]                                  [facility]   PHYSICIAN:  Tanya S. Shawnie Pons, M.D.                DATE OF BIRTH:  01/19/1961   DATE OF PROCEDURE:  06/08/2003  DATE OF DISCHARGE:                                 OPERATIVE REPORT   PREOPERATIVE DIAGNOSIS:  Question of squamous cell carcinoma of the cervix.   POSTOPERATIVE DIAGNOSIS:  Question of squamous cell carcinoma of the cervix.   PROCEDURE:  Cold knife cone.   SURGEON:  Shelbie Proctor. Shawnie Pons, M.D.   ANESTHESIA:  MAC with Burnett Corrente, M.D.   SPECIMENS:  Cervix.   FINDINGS:  A large fibroid uterus, large abdominal mass.  Cervix is deviated  off to the right.  No gross lesion on the cervix was visualized.   INDICATION FOR PROCEDURE:  In brief, the patient is a 50 year old gravida 0,  who has a Pap that shows squamous cell carcinoma.  She had a biopsy that  showed at least carcinoma in situ, question of invasion.  She is here for a  definitive diagnosis.   DESCRIPTION OF PROCEDURE:  The patient was taken to the OR.  She was placed  in dorsal lithotomy and Allen stirrups.  When anesthesia was adequate, she  was prepped and draped in the usual sterile fashion.  She was examined under  anesthesia, the cervix found to be way off to the patient's right.  Attempt  at visualization was attempted on several occasions and could not be  performed accurately.  The cervix was then felt with the fingers and grasped  with a ring forceps and then the speculum replaced and the cervix  visualized.  The cervix was then infiltrated with 0.25% Marcaine with  epinephrine.  An attempt was made to place a suture at 2 to 4 o'clock and  from 10 to 8 o'clock.  These sutures were tied down, then two double-toothed  tenacula are used to grasp the anterior and posterior lips of the cervix and  these  portions are removed separately.  The specimens are marked with  colored pins.  The anterior is different from the posterior, and I marked at  12 and 3 o'clock.  The cervix is then visualized and the electrocautery used  to obtain hemostasis, which was not successful.  Monsel's solution with  direct pressure is used x5 minutes x3 with hemostasis being obtained  somewhat, but there was still some bright red oozing.  At that time a  decision was made to pack the vagina and cervix with one-inch vaginal  packing dipped in K-Y Jelly.  This pack will be left in overnight and the  patient will remove it in the morning.  All instrument, needle, and lap  counts were correct  x2.  The patient was awakened and taken to the recovery  room in stable condition.                                               Shelbie Proctor. Shawnie Pons, M.D.    TSP/MEDQ  D:  06/08/2003  T:  06/09/2003  Job:  [id number]

## 2010-12-05 NOTE — Group Therapy Note (Signed)
NAME:  Teresa Dawson, SHIPLETT NO.:  [id number]   MEDICAL RECORD NO.:  [id number]          PATIENT TYPE:  WOC   LOCATION[facility]  [facility]                   FACILITY:  [facility]   PHYSICIAN:  Montey Hora, M.D.    DATE OF BIRTH:  June 07, 1961   DATE OF SERVICE:  12/23/2005                                    CLINIC NOTE   This is a 50 year old who had a TAH and left salpingo-oophorectomy in  February 2005 after positive __________ after a cold knife cone with a  previous diagnosis of CIN-III.  The patient has done well since then and is  presenting just for her annual Pap smear.  She denies any problems,  specifically no vaginal discharge, no hot flushes, no abdominal pain.  She  does have fluid retention and would like to take a fluid pill to help her  with that.   PAST MEDICAL HISTORY:  CIN-III.  Last Pap smear October 2005 was normal.  Last mammogram January 2007 was normal.   MEDICATIONS:  None.   ALLERGIES:  None.   PHYSICAL EXAMINATION:  VITAL SIGNS:  Temperature is 98.7, pulse is 80, BP is  126/82, weight is 213, which is up about 14 pounds since last year, and  height is 5 feet 5 inches.  GENERAL:  ODOW, NAD.  HEART:  RRR without murmur.  ABDOMEN:  Soft and nontender, no HSM or masses.  BREASTS:  No masses.  No supraclavicular or axillary lymphadenopathy  bilaterally.  PELVIC:  Vaginal mucosa is normal.  Cervix is absent.  Minimal physiologic  discharge.  Pap smear is sent.   ASSESSMENT AND PLAN:  1.  Annual exam after hysterectomy for cervical intraepithelial neoplasia      III.  Pap smear sent.  2.  Fluid retention, desire for fluid pill.  Gave hydrochlorothiazide 12.5      mg 1 p.o. daily p.r.n. fluid retention.  Warned of the possibility of      decreased electrolytes.  Patient warned to follow up for blood work      should she feel weak or tired, and she is encouraged to adhere to a      relatively low-carbohydrate, relatively low-fat diet.     ______________________________  Montey Hora, M.D.     KR/MEDQ  D:  12/23/2005  T:  12/24/2005  Job:  [id number]

## 2010-12-05 NOTE — Discharge Summary (Signed)
NAME:  Teresa Dawson, Teresa Dawson                          ACCOUNT NO.:  [id number]   MEDICAL RECORD NO.:  [id number]                   PATIENT TYPE:  INP   LOCATION:  0476                                 FACILITY:  [facility]   PHYSICIAN:  Tanya S. Shawnie Pons, M.D.                DATE OF BIRTH:  Dec 14, 1960   DATE OF ADMISSION:  08/22/2003  DATE OF DISCHARGE:  08/26/2003                                 DISCHARGE SUMMARY   FINAL DIAGNOSIS:  1. Severe cervical dysplasia.  2. Large symptomatic fibroid uterus.   PROCEDURES:  Total abdominal hysterectomy and left salpingo-oophorectomy.   LABORATORY DATA:  On admission, a hemoglobin of 13, postoperatively  hemoglobin was 10.2.  Other laboratory values were within normal limits.   HISTORY:  The patient is a 50 year old white female who had been previously  seen and found to have a Pap smear that revealed squamous cell carcinoma.  Biopsy and cone failed to find invasion.  The patient had a large 22 to 24  weeks fibroid uterus, and she desired definitive treatment.   HOSPITAL COURSE:  The patient was admitted on the day of surgery.  At that  time, she underwent a TAH and LSO without complications.  She had an  intraoperative consult with Dr. De Blanch who helped with most  of her surgery.  Pathology was reviewed on frozen section and found no  evidence of invasion.  Postoperatively, she was transferred to the PACU and  subsequently to the floor.  On postoperative day #1, she had a mild  temperature elevation at 101.  ___________requiring IV pain medication on  postoperative day #2.  Her IV pain control was decreased, she had regained  some bowel function, and began eating a solid diet.  She was ambulating at  that time, her Foley had been discontinued, she was voiding without  difficulty.  She still had not had a bowel movement.  On postoperative day  #3, her IV pain medication had been stopped.  She was tried on Vicodin,  however, one was not  enough and we were afraid of Tylenol toxicity, so an  attempt was made to put her on hydrocodone.  At that time, the pharmacy did  not supply hydrocodone, so the Vicodin was resumed.  She had a bowel  movement on postoperative day #3, and was doing well.  She has remained  afebrile and tolerating a regular diet.  She was ambulating on her own.  At  that time, it was felt she was stable for discharge on postoperative day #4.   DISCHARGE DISPOSITION AND CONDITION:  The patient is discharged home in good  condition.   DISCHARGE MEDICATIONS:  1. Keflex 500 mg t.i.d. for increasing erythema around the wound and     question of cellulitis.  2. Vicodin 5/500 one to two p.o. q.4-6h. p.r.n. pain.  She was instructed     not to take  more than 8 of these in a 24 hour period and not to use any     other Tylenol products.  3. The patient is also instructed she may use ibuprofen 600 mg p.o. q.6h.     p.r.n. for additional pain control.   FOLLOWUP:  1. At the [facility].  She will come in on Wednesday for a wound check and     staple removal.  2. She will also follow up in two more weeks for her regular postoperative     check.   The patient is instructed to call with fever, chills, or persistent nausea  or vomiting.                                               Shelbie Proctor. Shawnie Pons, M.D.    TSP/MEDQ  D:  08/26/2003  T:  08/26/2003  Job:  [id number]

## 2010-12-05 NOTE — H&P (Signed)
NAME:  Teresa Dawson, Teresa Dawson                          ACCOUNT NO.:  [id number]   MEDICAL RECORD NO.:  [id number]                   PATIENT TYPE:  INP   LOCATION:  X009                                 FACILITY:  [facility]   PHYSICIAN:  Shelbie Proctor. Shawnie Pons, M.D.                DATE OF BIRTH:  12-11-1960   DATE OF ADMISSION:  08/22/2003  DATE OF DISCHARGE:                                HISTORY & PHYSICAL   CHIEF COMPLAINT:  Here for hysterectomy.   HISTORY OF PRESENT ILLNESS:  The patient is a 50 year old gravida 0, who had  CIN-3 with positive margins after a cold knife cone, and has a large fibroid  uterus who desires definitive treatment.   PAST MEDICAL HISTORY:  1. Hypertension.  2. Elevated cholesterol.   PAST SURGICAL HISTORY:  1. Cold knife cone.  2. Deviated septum.   MEDICATIONS:  None.   ALLERGIES:  SULFA.   GYNECOLOGICAL HISTORY:  Abnormal Pap, culpo, and cold knife cone.   FAMILY HISTORY:  Coronary artery disease, hypertension.   SOCIAL HISTORY:  No tobacco, alcohol, or drug use.   PHYSICAL EXAMINATION:  GENERAL:  She is an obese female weighing 213 pounds.  This is an obese female in no acute distress.  VITAL SIGNS:  Blood pressure is 145/89, pulse is 109, respiratory rate is  16.  HEENT:  Normocephalic, atraumatic.  Sclerae anicteric.  NECK:  Supple.  LUNGS:  Clear.  HEART:  Regular.  BREASTS:  No masses, everted nipples.  ABDOMEN:  Large mass lower abdomen.  There is no significant  lymphadenopathy.  PELVIC:  She has normal external female genitalia with numerous skin tags on  the anterior thighs.  The vagina is within normal limits except for the  large mass in the posterior cul-de-sac.  Cervix is oriented to the patient's  right side and is difficult to visualize, but no masses palpable.  The  uterus is large, 22 to 24 weeks size.  Adnexa are unable to be appreciated.  EXTREMITIES:  No cyanosis, clubbing, or edema.  SKIN:  No rash.   LABORATORY DATA:  CT was  negative.  Preoperative hemoglobin was 13.7.  CMP  was within normal limits prior to proceeding.  Her coags were also within  normal limits.  The patient had previously undergone an extensive cardiac  workup to include echo as well as a Cardiolite stress test which were all  negative.   IMPRESSION:  CIN-3 endocervical margins and a large fibroid uterus.   PLAN:  Total abdominal hysterectomy.   The patient has this procedure discussed with her, and she has decided she  would like to keep her ovaries.  She was counseled regarding risks and  benefits of this, the entire procedure, and the plan for sending the cervix  for frozen pathology during the case to see if she needed anything further  done.  The patient agrees with the plan.  We will proceed with the  procedure.                                               Shelbie Proctor. Shawnie Pons, M.D.    TSP/MEDQ  D:  08/22/2003  T:  08/22/2003  Job:  [id number]

## 2010-12-05 NOTE — Op Note (Signed)
NAME:  Teresa Dawson, Teresa Dawson                          ACCOUNT NO.:  [id number]   MEDICAL RECORD NO.:  [id number]                   PATIENT TYPE:  INP   LOCATION:  0476                                 FACILITY:  [facility]   PHYSICIAN:  Tanya S. Shawnie Pons, M.D.                DATE OF BIRTH:  [birth year]   DATE OF PROCEDURE:  08/22/2003  DATE OF DISCHARGE:                                 OPERATIVE REPORT   PREOPERATIVE DIAGNOSIS:  Fibroid uterus, CIN III with positive margins.   PROCEDURE:  TAH/LSO.   SURGEON:  Shelbie Proctor. Shawnie Pons, M.D.   ASSISTED BY:  Lesly Dukes, M.D.   INTRAOPERATIVE CONSULT:  De Blanch, M.D..   ANESTHESIA:  General.   ESTIMATED BLOOD LOSS:  800 cc.   COMPLICATIONS:  None.   SPECIMENS:  Uterus, cervix, left tube and ovary to pathology.   PROCEDURE:  The patient was taken to the OR where she was placed in the  dorsal lithotomy and Allen stirrups.  She was then prepped and draped in the  usual sterile fashion.  A knife was used to make an incision vertically from  the umbilicus to the pubic bone after marking the skin with the pen.  An  incision was carried down to the underlying fascia with the electrocautery.  The fascia was then incised sharply with the cautery and dissected down  using the electrocautery.  The midline was then determined.  The muscle and  peritoneum were dissected down off the fascia and then the peritoneum  entered sharply.  This incision was then extended superiorly and inferiorly.  The uterus was large and taking up the entire lower abdominal cavity.  The  anatomy was noted to be very obscure.  The patient's left round was  visualized across the uterus so it was grasped with the Babcock clamp and  suture ligated.  It was then divided with electrocautery.  The anterior  portion of the broad ligament was then dissected down and a bladder flap  created.  Similarly, the patient's right round ligament was grasped with a  Babcock clamp and  suture ligated and then divided with the electrocautery  with the continuation of the bladder flap and the anterior leaf of the broad  being dissected here.  Attention was then turned to the left tube and ovary  where it was felt that these were adhesed to the sigmoid colon and that this  needed to be taken down.  Initially attempt was made to take some of it down  but then due to distorted anatomy and being unsure of where ureter was, this  line of approach was not perceived.  There was a cyst on the left ovary,  however.  Decision was made to take the tubo-ovarian pedicle to make sure  that the ureter remained ___________ ureter later and get that tube and  ovary if we needed to.  So the  tube and ovary were separately clamped with  clamps and suture ligated and then a clamp placed on the uterus for  bleeding.  Similarly, the right tubo-ovarian pedicle was then doubly  clamped, cut and suture ligated.  At this point, the uterus was not very  mobile.  There was a large fibroid filling the entire posterior cul-de-sac  and the uterus will not come up.  Given the obscurity of the view, it was  felt we should extend the incision further and the incision was extended up  past the belly button.  At this point, Serita Kyle, M.D.,  was called  and assisted greatly in first finding the ureters bilaterally, dissecting  off the broad ligament, isolating the uterine arteries and once the uterine  arteries were isolated, they were similarly clamped and ligated.  When the  blood supply to the uterus was cut off, the uterus was cut off such that the  large fibroid uterus would not obscure our view.  Clamps were then used to  clamp down the remaining bits of the tissue holding the cervix together  which was then cut and suture ligated.  Until the cardinal uterosacral  complex was grasped bilaterally.  The cervix was then removed in toto and  sent to pathology.  The uterosacral ligaments were then  tied with a Haney  ligature and held on to.  The vaginal cuff was closed using a figure of  eight.  The rectovaginal space was then opened and then subsequently closed  by Dr. Stanford Breed to prevent bleeding.  The packs were then use to aid  in bleeding while the round ligaments and the ureters were dissected out and  followed out again.  Also, the left tube and ovary were re-examined and  dissected free from the sigmoid colon.  The IP was then isolated and doubly  clamped with the Anderson clamps and then a free tie was placed behind the  clamp followed by a suture ligature and good hemostasis was obtained.  At  this point all instruments and laps were removed from the abdominal cavity.  Attention was turned to closure.  The fascia was closed with a 0 PDS loop  suture from the top to the middle and from the bottom to the middle.  The  edges of the umbilicus were then put together with the 0 plain.  The skin  was closed using clips.  All instrument, needle and lap counts were correct  x 2.  Pathology was reviewed at the end of the case and the patient has no  cervical cancer.  A pressure dressing was then placed over the wounds.  Betadine was cleaned off and the patient was awakened and sent to the  recovery room in stable condition.                                               Shelbie Proctor. Shawnie Pons, M.D.    TSP/MEDQ  D:  08/22/2003  T:  08/23/2003  Job:  [id number]

## 2010-12-05 NOTE — Group Therapy Note (Signed)
NAME:  Teresa Dawson, Teresa Dawson NO.:  [id number]   MEDICAL RECORD NO.:  [id number]                   PATIENT TYPE:  OUT   LOCATION:  [facility]                           FACILITY:  [facility]   PHYSICIAN:  Tinnie Gens, MD                     DATE OF BIRTH:  03-21-1961   DATE OF SERVICE:  05/11/2003                                    CLINIC NOTE   CHIEF COMPLAINT:  Annual exam.   HISTORY OF PRESENT ILLNESS:  The patient is a 50 year old G0 who comes in  today for annual exam and Pap smear.  She has not had a Pap smear for the  last three to four years or a mammogram either.  Accordingly, she has had a  history of abnormal Paps four years ago with colposcopy and repeat Pap  smears.  Her cycles are regular and every four weeks and have not changed  significantly.  She also reports that she has some right-sided pelvic pain,  especially around the time of ovulation and feels that her right ovary is  swollen.  This pain does persist throughout the cycle.   PAST MEDICAL HISTORY:  1. High blood pressure.  2. High cholesterol.   PAST SURGICAL HISTORY:  Deviated septum in 1989.   MEDICATIONS:  None.   ALLERGIES:  SULFA.   GYNECOLOGICAL HISTORY:  Menarche 68.  LMP October 13, every month, five  days.  No birth control.  Previous Paps outlined in HPI.   OBSTETRICAL HISTORY:  G0.   FAMILY HISTORY:  Coronary artery disease - father; hypertension - mother;  Alzheimer's - mother.   SOCIAL HISTORY:  No tobacco, alcohol, or drug use.  Works outside the home.  Lives with roommate.   REVIEW OF SYSTEMS:  Positive bruising, muscle aches, dizzy spells, vision  changes, loss of urine with coughing, vaginal odor, occasional chest pain.  Negative numbness, weakness in fingers, swelling in legs, fevers, night  sweats, fatigue, weight loss, weight gain, problems with hearing, cough,  shortness of breath, nausea or vomiting, blood in stool, dysuria, vaginal  bleeding.   PHYSICAL EXAMINATION:  VITAL SIGNS:  Blood pressure 83, blood pressure  142/93, weight 217.3, height 5 feet 5 inches.  GENERAL:  Well-developed, well-nourished, moderately-obese female, no acute  distress.  HEENT:  Sclerae anicteric.  NECK:  Supple, normal thyroid.  LUNGS:  Clear.  CARDIOVASCULAR:  Regular, no murmur.  SKIN:  Numerous moles noted and hemangiomas.  BREASTS:  Symmetric, everted nipples, no masses.  LYMPH NODES:  No supraclavicular or axillary lymphadenopathy.  ABDOMEN:  Soft, nontender, nondistended.  Questionable mass in the lower  portion.  EXTREMITIES:  No cyanosis, clubbing, or edema.  GENITOURINARY:  Normal external female genitalia.  Numerous skin tags on the  inner thighs.  Vagina is rugated.  There is a yellow discharge.  Cervix is  visualized without lesion.  The uterus is  enlarged and retroverted, fills  the posterior cul-de-sac.  The right and left adnexa shape and contour could  not be appreciated secondary to body habitus but there was diffuse  tenderness throughout the pelvis.   IMPRESSION:  1. GYN exam.  2. Enlarged uterus.  3. History of abnormal Pap.  4. Elevated blood pressure.   PLAN:  1. Pap today.  2. Ultrasound of the pelvis to look at the enlarged uterus.  3. Mammography.  4. Advised this patient to go to a general medical physician to resume care     for high blood pressure and high cholesterol as well as to manage other     pertinent findings in her review of systems.                                               Tinnie Gens, MD    TP/MEDQ  D:  05/11/2003  T:  05/11/2003  Job:  [id number]

## 2010-12-05 NOTE — Group Therapy Note (Signed)
NAME:  Teresa Dawson, Teresa Dawson NO.:  [id number]   MEDICAL RECORD NO.:  [id number]                   PATIENT TYPE:  OUT   LOCATION:  [facility]                           FACILITY:  [facility]   PHYSICIAN:  Tinnie Gens, MD                     DATE OF BIRTH:  05-12-1961   DATE OF SERVICE:  07/12/2003                                    CLINIC NOTE   CHIEF COMPLAINT:  Preoperative exam.   HISTORY OF PRESENT ILLNESS:  The patient is a 50 year old G0 who has a large  fibroid uterus and persistent CIN III.  Apparently she underwent a cold  knife cone approximately four weeks ago and had positive endocervical  margins.  She desires definitive treatment and would like a hysterectomy.   PAST MEDICAL HISTORY:  Notable for hypertension and elevated cholesterol.   PAST SURGICAL HISTORY:  Deviated septum repair, cold knife cone.   GYNECOLOGICAL HISTORY:  Abnormal Pap, colposcopy x2, previous cold knife  cone.   FAMILY HISTORY:  Coronary artery disease.   SOCIAL HISTORY:  No tobacco, alcohol, or drugs.   ALLERGIES:  SULFA.   MEDICATIONS:  None.   PHYSICAL EXAMINATION:  VITAL SIGNS:  Pulse is 109, weight is 239, blood  pressure is 145/89.  GENERAL:  She is an obese white female in no acute distress.  HEENT:  Normocephalic, atraumatic.  Sclerae anicteric.  NECK:  Supple.  LUNGS:  Clear.  HEART:  Regular.  BREASTS:  No mass.  Everted nipples  ABDOMEN:  Large mass in the lower abdomen.  No lymphadenopathy.  GENITALIA:  She has normal external female genitalia except for numerous  skin tags on the thigh area.  PELVIC:  Vagina is within normal limits except for a large mass filling the  posterior cul-de-sac.  The cervix is oriented to the patient's right except  for __________.  No mass palpated.  Her sutures are still in place.  The  cervix appears to be healing from the cone.  The uterus is large and  approximately 22 to 24 weeks size with a knotty appearance consistent  with  fibroids.  Her adnexa are unable to be appreciated.  EXTREMITIES:  No clubbing, cyanosis, or edema.  SKIN:  No rash.   IMPRESSION:  1. Cervical intraepithelial neoplasia-3 with positive margins after a cold     knife cone.  2. Large fibroid uterus.   PLAN:  For a TAH with ovarian preservation with De Blanch, M.D.  on standby if needed.  The plan will include a vertical incision.  The  patient was counseled on risks and benefits of the procedure including risks  of bleeding, infection, injuries to surrounding structures including bowel  and ureter and bladder as well as intraoperative death.  The patient  understood these risks and agreed to proceed.  The patient will be scheduled  for the second week in January.  Tinnie Gens, MD    TP/MEDQ  D:  07/12/2003  T:  07/12/2003  Job:  [id number]

## 2010-12-05 NOTE — Op Note (Signed)
NAME:  Teresa Dawson, Teresa Dawson                          ACCOUNT NO.:  [id number]   MEDICAL RECORD NO.:  [id number]                   PATIENT TYPE:  INP   LOCATION:  0476                                 FACILITY:  [facility]   PHYSICIAN:  De Blanch, M.D.         DATE OF BIRTH:  Apr 15, 1961   DATE OF PROCEDURE:  08/22/2003  DATE OF DISCHARGE:                                 OPERATIVE REPORT   PREOPERATIVE DIAGNOSES:  High grade dysplasia of the cervix, symptomatic  uterine fibroids.   POSTOPERATIVE DIAGNOSES:  High grade dysplasia of the cervix, symptomatic  uterine fibroids. Retroperitoneal fibrosis and intraabdominal adhesions.   PROCEDURE:  Total abdominal hysterectomy, left salpingo-oophorectomy,  ureterolysis.   SURGEON:  De Blanch, M.D.   ASSISTANTS:  1. Shelbie Proctor. Shawnie Pons, M.D.  2. Lesly Dukes, M.D.   ANESTHESIA:  General with oral tracheal tube.   ESTIMATED BLOOD LOSS:  650 mL   FINDINGS:  I was called to the operating room to help doctors Shawnie Pons and  Penne Lash with a difficult abdominal hysterectomy in a patient who had very  large uterine fibroids and extensive intraabdominal and retroperitoneal  adhesions.  The uterus was approximately [redacted] weeks gestational size with  multiple fibroids extending into the retroperitoneal broad ligament and  distending and distorting the bladder anteriorly. There was retroperitoneal  fibrosis bilaterally requiring ureterolysis to protect the ureters in the  course of performing a hysterectomy.  The upper abdomen is normal and there  was no palpable adenopathy.   Upon entering the operating room, the abdomen had previously been opened  through an ample midline incision.  The Bookwalter retractor was assembled.  The ovarian vessels and fallopian tubes had been previously transected from  their attachments to the uterus and had been ligated.  The round ligaments  had also been divided and the peritoneum overlying the bladder had  been  incised. We proceeded to open the retroperitoneal spaces identifying the  pelvic sidewall vessels and the ureter. The uterus is large and exposure was  very difficult but we carefully dissected the uterine fibroid away from the  left broad ligament all the while performing ureterolysis to protect the  ureter.  A similar procedure was performed on the right side of the pelvis  with the ureterolysis being performed and the broad ligament being incised.  The bladder flap was then advanced sharply off of a large anterior fundal  fibroid and then down to the level of the cervix where it was advanced along  the proper plane.  Once this was accomplished, the uterine vessels were  clamped, divided and suture ligated.  An additional clamp was placed caudad  to the uterine vessels, divided and suture ligated.  The rectovaginal septum  was then incised and developed further mobilizing the cervix.  In order to  gain better exposure, a supracervical hysterectomy was performed.  The  uterus and upper portion of the cervix were handed off  the operative field  thus giving better exposure to the pelvis.  The remaining cervical stump was  grasped with Kocher clamps, the bladder flap advanced further as was the  rectovaginal septum and then the paracervical and cardinal ligaments  clamped, cut and suture ligated.  The vaginal angles were then cross clamped  and divided and the vagina transected from its connection to the cervix and  the cervical stump handed off the operative field for frozen section because  she had previously had a positive margin of conization. The vagina angles  were transfixed with #0 Vicryl with a central portion of the vagina closed  with interrupted figure-of-eight sutures of #0 Vicryl. The rectovaginal  septum was then closed with interrupted sutures of #0 Vicryl suturing from  the vaginal cuff to the peritoneum overlying the rectum and thus closing the  rectovaginal space.    We thereafter explored the pelvis and established hemostasis with cautery  and some hemoclips.   We then inspected the ovaries and it was found that the left ovary was  bleeding from adhesions to the sigmoid colon.  These adhesions were further  lysed and the ovarian vessels skeletonized, clamped, cut, free tied and  suture ligated.  The left tube and ovary were handed off the operative  field. The right tube and ovary appeared normal and were then sutured to the  lateral pelvic sidewall to keep them from falling into the cul-de-sac.  The  pelvis was irrigated, again explored and hemostasis established.  At this  junction, doctors Leggett and Shawnie Pons proceeded to complete the case and close  the abdominal wall.                                               De Blanch, M.D.    DC/MEDQ  D:  08/22/2003  T:  08/23/2003  Job:  [id number]   cc:   Shelbie Proctor. Shawnie Pons, M.D.  Fax: [phone]   Lesly Dukes, M.D.   Telford Nab, R.N.  [address]  [place], [place] [address]

## 2010-12-05 NOTE — Group Therapy Note (Signed)
   NAME:  Teresa Dawson, Teresa Dawson NO.:  [id number]   MEDICAL RECORD NO.:  [id number]                   PATIENT TYPE:  OUT   LOCATION:  [facility]                           FACILITY:  [facility]   PHYSICIAN:  Tinnie Gens, MD                     DATE OF BIRTH:  1961/06/26   DATE OF SERVICE:  05/17/2003                                    CLINIC NOTE   CHIEF COMPLAINT:  Test results.   HISTORY OF PRESENT ILLNESS:  The patient is a 50 year old G0 who came in for  an annual exam approximately five days ago.  Her Pap smear was called back  today as squamous cell carcinoma and she was called in.  Discussed with this  patient the limitations of Pap smear as well as cervical cancer and possible  treatment options. We decided to go ahead with colposcopy and biopsy today.   PROCEDURE:  Following the above discussion the patient was taken to the  colposcopy room and set up in dorsal lithotomy.  A speculum was then  inserted into the vagina.  An attempt was made to visualize the cervix.  When the cervix could not be visualized the speculum was removed.  A  bimanual exam was performed.  The cervix still felt nulliparous and small,  was deviated off to the patient's left side.  There was a large mass filling  the posterior cul-de-sac.  The speculum was replaced with an adjustment made  to find the cervix and half of the cervix could be visualized.  It still  appeared small.  There was no frank mass.  Acetic acid was applied.  There  were several acetowhite areas and biopsies were taken at 2 o'clock and 5  o'clock since this was the only thing that could be adequately visualized.  Monsel's was used to control hemostasis.  The patient was then told that we  would call her back in ten days when her pathology came back.                                               Tinnie Gens, MD    TP/MEDQ  D:  05/17/2003  T:  05/17/2003  Job:  [id number]

## 2011-03-30 ENCOUNTER — Telehealth: Payer: Self-pay | Admitting: *Deleted

## 2011-03-30 NOTE — Telephone Encounter (Signed)
Pt twisted knee and would like prescription cream for pain.  Cannot come in today.

## 2011-03-31 NOTE — Telephone Encounter (Signed)
Called to make pt aware.  Left a message for pt to return call.

## 2011-03-31 NOTE — Telephone Encounter (Signed)
No she would need an OV for this

## 2011-04-01 NOTE — Telephone Encounter (Signed)
Spoke with pt and she is doing much better.

## 2011-04-17 ENCOUNTER — Telehealth: Payer: Self-pay | Admitting: *Deleted

## 2011-04-17 NOTE — Telephone Encounter (Addendum)
Pt takes Lisinopril/Hctz and Ibuprofen 800 mg q am, and  Dr. Neil Crouch said that these two drugs can cause kidney failure.

## 2011-04-17 NOTE — Telephone Encounter (Signed)
I am well aware that either of these meds have the potential to harm the kidneys but we are monitoring this closely. Her labs in April were fine, including her renal function. As long as we check this every 6 months she should do just fine.

## 2011-04-20 NOTE — Telephone Encounter (Signed)
LMTCB

## 2011-04-20 NOTE — Telephone Encounter (Signed)
Pt given Dr. Claris Che recommendations.

## 2011-07-05 ENCOUNTER — Encounter (HOSPITAL_COMMUNITY): Payer: Self-pay | Admitting: Emergency Medicine

## 2011-07-05 ENCOUNTER — Emergency Department (HOSPITAL_COMMUNITY)
Admission: EM | Admit: 2011-07-05 | Discharge: 2011-07-05 | Disposition: A | Discharge status: Home / Self Care | Payer: Self-pay | Attending: Emergency Medicine | Admitting: Emergency Medicine

## 2011-07-05 DIAGNOSIS — H00029 Hordeolum internum unspecified eye, unspecified eyelid: Secondary | ICD-10-CM | POA: Insufficient documentation

## 2011-07-05 DIAGNOSIS — I10 Essential (primary) hypertension: Secondary | ICD-10-CM | POA: Insufficient documentation

## 2011-07-05 DIAGNOSIS — E785 Hyperlipidemia, unspecified: Secondary | ICD-10-CM | POA: Insufficient documentation

## 2011-07-05 MED ORDER — CEPHALEXIN 500 MG PO CAPS: 500.0000 mg | ORAL_CAPSULE | Freq: Four times a day (QID) | ORAL | Status: DC

## 2011-07-05 MED ORDER — CEPHALEXIN 500 MG PO CAPS: 500.0000 mg | ORAL_CAPSULE | Freq: Four times a day (QID) | ORAL | Status: AC

## 2011-07-05 MED ORDER — ERYTHROMYCIN 2 % EX OINT: TOPICAL_OINTMENT | CUTANEOUS | Status: DC

## 2011-07-05 NOTE — ED Provider Notes (Signed)
Evaluation and management procedures were performed by the PA/NP under my supervision/collaboration.   Dione Booze, MD 07/05/11 [phone]

## 2011-07-05 NOTE — ED Provider Notes (Signed)
History     CSN: [id number] Arrival date & time: 07/05/2011 11:22 AM   First MD Initiated Contact with Patient 07/05/11 1136      Chief Complaint  Patient presents with  . Eye Problem   HPI Patient presents to the emergency room with complaint of eye irritation since Saturday. States that she was rubbing her eyes a lot Friday night. Denies any visual changes or deficits. Patient denies any pain at the eye.   Past Medical History  Diagnosis Date  . Depression   . Hyperlipidemia   . Hypertension     Past Surgical History  Procedure Date  . Total abdominal hysterectomy     right soo    Family History  Problem Relation Age of Onset  . Hypertension Other   . Heart disease Other     History  Substance Use Topics  . Smoking status: Never Smoker   . Smokeless tobacco: Not on file  . Alcohol Use: No    OB History    Grav Para Term Preterm Abortions TAB SAB Ect Mult Living                  Review of Systems  Constitutional: Negative for fever, chills, diaphoresis and appetite change.  HENT: Negative for neck pain.   Eyes: Negative for photophobia, pain, discharge, redness, itching and visual disturbance.  Respiratory: Negative for cough, chest tightness and shortness of breath.   Cardiovascular: Negative for chest pain.  Gastrointestinal: Negative for nausea, vomiting and abdominal pain.  Genitourinary: Negative for flank pain.  Musculoskeletal: Negative for back pain.  Skin: Negative for rash.  Neurological: Negative for weakness and numbness.  All other systems reviewed and are negative.    Allergies  Sulfonamide derivatives  Home Medications   Current Outpatient Rx  Name Route Sig Dispense Refill  . ASPIRIN 325 MG PO TABS Oral Take 325 mg by mouth daily.      Marland Kitchen CALCIUM CARBONATE-VITAMIN D 600-400 MG-UNIT PO TABS Oral Take 1 tablet by mouth 2 (two) times daily.      . OMEGA-3 FATTY ACIDS 1000 MG PO CAPS Oral Take 1 g by mouth 3 (three) times daily.       . IBUPROFEN 800 MG PO TABS Oral Take 1 tablet (800 mg total) by mouth every 6 (six) hours as needed. 60 tablet 11  . LISINOPRIL-HYDROCHLOROTHIAZIDE 10-12.5 MG PO TABS Oral Take 1 tablet by mouth daily. 30 tablet 11    Needs office visit soon  . SIMVASTATIN 40 MG PO TABS Oral Take 1 tablet (40 mg total) by mouth at bedtime. 30 tablet 11    Denied pt needs office visit    BP 136/78  Pulse 115  Temp(Src) 98 F (36.7 C) (Oral)  Resp 16  Ht 5\' 4"  (1.626 m)  Wt 260 lb (117.935 kg)  BMI 44.63 kg/m2  SpO2 100%  Physical Exam  Nursing note and vitals reviewed. Constitutional: She is oriented to person, place, and time. She appears well-developed and well-nourished.  Non-toxic appearance. No distress.       Vital signs stable  HENT:  Head: Normocephalic and atraumatic.  Mouth/Throat: Oropharynx is clear and moist.  Eyes: Conjunctivae and EOM are normal. Pupils are equal, round, and reactive to light. Right eye exhibits no discharge. Left eye exhibits hordeolum. Left eye exhibits no chemosis, no discharge and no exudate. No foreign body present in the left eye. Right conjunctiva is not injected. Right conjunctiva has no hemorrhage. Left conjunctiva  is not injected. Left conjunctiva has no hemorrhage.       Inflammation noted to the lower eyelid  Neck: Normal range of motion. Neck supple.  Cardiovascular: Normal rate, regular rhythm, normal heart sounds and intact distal pulses.  Exam reveals no gallop and no friction rub.   No murmur heard. Pulmonary/Chest: Effort normal and breath sounds normal. No respiratory distress. She has no wheezes.  Abdominal: Soft. Bowel sounds are normal. There is no tenderness. There is no rebound and no guarding.  Musculoskeletal: Normal range of motion. She exhibits no edema and no tenderness.  Neurological: She is alert and oriented to person, place, and time. She exhibits normal muscle tone.  Skin: Skin is warm and dry. No rash noted. She is not diaphoretic.   Psychiatric: She has a normal mood and affect. Her behavior is normal. Judgment and thought content normal.    ED Course  Procedures (including critical care time)  Patient seen and evaluated.  VSS reviewed. . Nursing notes reviewed. Vision 20/20 in right and left eye. No problems with the eye. Internal hordeolum noted to the lower eyelid of left eye, swelling noted to the left lower lid. Will start on topical and oral antibiotic concern for early periorbital cellulitis. Will monitor the patient closely. They agree with the treatment plan and diagnosis. No signs of orbital cellulitis, no pain with EOMI, proptosis or visual changes.     MDM  Internal hordeolum         Demetrius Charity, PA 07/05/11 1249

## 2011-07-05 NOTE — ED Notes (Signed)
Pt states woke up Sat am with eye redness then this am swelling started. Denies drainage or itching, states feels "swollen" but no pain. States has flushed eyes with tap water several times.

## 2011-08-11 ENCOUNTER — Encounter: Payer: Self-pay | Admitting: Family Medicine

## 2011-08-11 ENCOUNTER — Ambulatory Visit (INDEPENDENT_AMBULATORY_CARE_PROVIDER_SITE_OTHER): Payer: Self-pay | Admitting: Family Medicine

## 2011-08-11 VITALS — BP 124/82 | HR 116 | Temp 98.9°F | Wt 260.0 lb

## 2011-08-11 DIAGNOSIS — J40 Bronchitis, not specified as acute or chronic: Secondary | ICD-10-CM

## 2011-08-11 MED ORDER — HYDROCODONE-HOMATROPINE 5-1.5 MG/5ML PO SYRP: 5.0000 mL | ORAL_SOLUTION | ORAL | Status: DC | PRN

## 2011-08-11 MED ORDER — AZITHROMYCIN 250 MG PO TABS: ORAL_TABLET | ORAL | Status: AC

## 2011-08-11 NOTE — Progress Notes (Signed)
  Subjective:    Patient ID: Teresa Dawson, female    DOB: 1961/05/22, 51 y.o.   MRN: [id number]  HPI Here for one week of chest tightness and coughing up green sputum. No fever. On Delsym.    Review of Systems  Constitutional: Negative.   HENT: Negative.   Eyes: Negative.   Respiratory: Positive for cough and chest tightness.        Objective:   Physical Exam  Constitutional: She appears well-developed and well-nourished.  HENT:  Right Ear: External ear normal.  Left Ear: External ear normal.  Nose: Nose normal.  Mouth/Throat: Oropharynx is clear and moist. No oropharyngeal exudate.  Eyes: Conjunctivae are normal.  Neck: No thyromegaly present.  Pulmonary/Chest: Effort normal. She has no wheezes. She has no rales.       Scattered rhonchi  Lymphadenopathy:    She has no cervical adenopathy.          Assessment & Plan:  Recheck prn

## 2011-08-21 ENCOUNTER — Telehealth: Payer: Self-pay | Admitting: *Deleted

## 2011-08-21 MED ORDER — DOXYCYCLINE HYCLATE 100 MG PO TABS: 100.0000 mg | ORAL_TABLET | Freq: Two times a day (BID) | ORAL | Status: AC

## 2011-08-21 MED ORDER — HYDROCODONE-HOMATROPINE 5-1.5 MG/5ML PO SYRP: 5.0000 mL | ORAL_SOLUTION | ORAL | Status: AC | PRN

## 2011-08-21 NOTE — Telephone Encounter (Signed)
Call in Doxycycline 100 mg bid for 10 days, also refill the Hydromet for 240 ml

## 2011-08-21 NOTE — Telephone Encounter (Signed)
Pt finished the Zpack, and is still coughing, and is asking if she needs to take anything else.  She would like to have the cough meds refilled.

## 2011-08-21 NOTE — Telephone Encounter (Signed)
Scripts sent in and tried to reach pt and no answer.

## 2011-10-01 ENCOUNTER — Encounter: Payer: Self-pay | Admitting: Family Medicine

## 2011-10-01 ENCOUNTER — Ambulatory Visit (INDEPENDENT_AMBULATORY_CARE_PROVIDER_SITE_OTHER): Payer: Self-pay | Admitting: Family Medicine

## 2011-10-01 VITALS — BP 120/80 | HR 99 | Temp 98.5°F | Wt 266.0 lb

## 2011-10-01 DIAGNOSIS — I10 Essential (primary) hypertension: Secondary | ICD-10-CM

## 2011-10-01 DIAGNOSIS — E785 Hyperlipidemia, unspecified: Secondary | ICD-10-CM

## 2011-10-01 DIAGNOSIS — R7309 Other abnormal glucose: Secondary | ICD-10-CM

## 2011-10-01 DIAGNOSIS — R739 Hyperglycemia, unspecified: Secondary | ICD-10-CM

## 2011-10-01 LAB — HEPATIC FUNCTION PANEL
ALT: 65 U/L — ABNORMAL HIGH (ref 0–35)
AST: 48 U/L — ABNORMAL HIGH (ref 0–37)
Albumin: 4.1 g/dL (ref 3.5–5.2)
Alkaline Phosphatase: 57 U/L (ref 39–117)
Bilirubin, Direct: 0.1 mg/dL (ref 0.0–0.3)
Total Protein: 7.2 g/dL (ref 6.0–8.3)

## 2011-10-01 LAB — LIPID PANEL
HDL: 51.2 mg/dL (ref >39.00)
VLDL: 18.4 mg/dL (ref 0.0–40.0)

## 2011-10-01 LAB — BASIC METABOLIC PANEL
BUN: 11 mg/dL (ref 6–23)
Calcium: 9.4 mg/dL (ref 8.4–10.5)
Creatinine, Ser: 0.8 mg/dL (ref 0.4–1.2)
GFR: 86 mL/min (ref >60.00)
Glucose, Bld: 103 mg/dL — ABNORMAL HIGH (ref 70–99)

## 2011-10-01 MED ORDER — IBUPROFEN 800 MG PO TABS: 800.0000 mg | ORAL_TABLET | Freq: Four times a day (QID) | ORAL | Status: DC | PRN

## 2011-10-01 MED ORDER — LISINOPRIL-HYDROCHLOROTHIAZIDE 10-12.5 MG PO TABS: 1.0000 | ORAL_TABLET | Freq: Every day | ORAL | Status: DC

## 2011-10-01 MED ORDER — SIMVASTATIN 40 MG PO TABS: 40.0000 mg | ORAL_TABLET | Freq: Every day | ORAL | Status: DC

## 2011-10-01 MED ORDER — FLUTICASONE PROPIONATE 50 MCG/ACT NA SUSP: 2.0000 | Freq: Every day | NASAL | Status: DC

## 2011-10-01 NOTE — Progress Notes (Signed)
  Subjective:    Patient ID: Teresa Dawson, female    DOB: 24-Sep-1960, 51 y.o.   MRN: [id number]  HPI Here for follow up. She feels fine and has no complaints. She admits to not watching her diet very closely.    Review of Systems  Constitutional: Negative.   Respiratory: Negative.   Cardiovascular: Negative.        Objective:   Physical Exam  Constitutional: She appears well-developed and well-nourished.  Neck: No thyromegaly present.  Cardiovascular: Normal rate, regular rhythm, normal heart sounds and intact distal pulses.   Pulmonary/Chest: Effort normal and breath sounds normal.  Lymphadenopathy:    She has no cervical adenopathy.          Assessment & Plan:  Get fasting labs

## 2011-10-05 NOTE — Progress Notes (Signed)
Quick Note:  Pt aware and labs sent to pt's address. ______

## 2011-10-09 ENCOUNTER — Other Ambulatory Visit: Payer: Self-pay | Admitting: Family Medicine

## 2011-11-30 ENCOUNTER — Telehealth: Payer: Self-pay | Admitting: *Deleted

## 2011-11-30 NOTE — Telephone Encounter (Signed)
Advised patient that she was due for her yearly exam and would need to come in if she was having a reason that would require the estrace cream. Pt states that she will call her PCP because she was recently seen there and will see if he can prescribe for [place]. She will call us back if she needs any further assistance.

## 2011-11-30 NOTE — Telephone Encounter (Signed)
Pt left a message stating that she was seen last may by Dr. Okey Dupre. He gave her a prescription for estrace cream but she never got it filled. She has decided now that she would like to start using it. She wanted to know if we can call it in for her.

## 2012-09-05 ENCOUNTER — Telehealth: Payer: Self-pay | Admitting: Family Medicine

## 2012-09-05 NOTE — Telephone Encounter (Signed)
Pt must schedule office visit.

## 2012-09-05 NOTE — Telephone Encounter (Signed)
Patient will call back to reschedule.

## 2012-09-05 NOTE — Telephone Encounter (Signed)
Patient twisted her knee 1w-10days ago. Ibuprofen not cutting it. Wants to know if she can get rx for Vicodin. Please advise and call.

## 2012-10-26 ENCOUNTER — Other Ambulatory Visit: Payer: Self-pay

## 2012-10-26 DIAGNOSIS — Z1231 Encounter for screening mammogram for malignant neoplasm of breast: Secondary | ICD-10-CM

## 2012-10-31 ENCOUNTER — Other Ambulatory Visit: Payer: Self-pay | Admitting: Family Medicine

## 2012-10-31 NOTE — Telephone Encounter (Signed)
Can we refill these? 

## 2012-11-03 NOTE — Telephone Encounter (Signed)
Can you help w/ this? Pt would like to know when this is done.

## 2012-11-07 NOTE — Telephone Encounter (Signed)
She can have 30 days of her meds, but she needs an OV soon for any more

## 2012-11-22 ENCOUNTER — Ambulatory Visit (INDEPENDENT_AMBULATORY_CARE_PROVIDER_SITE_OTHER): Payer: BC Managed Care – PPO | Admitting: Family Medicine

## 2012-11-22 ENCOUNTER — Ambulatory Visit
Admission: RE | Admit: 2012-11-22 | Discharge: 2012-11-22 | Disposition: A | Discharge status: Home / Self Care | Payer: BC Managed Care – PPO | Source: Ambulatory Visit

## 2012-11-22 ENCOUNTER — Encounter: Payer: Self-pay | Admitting: Family Medicine

## 2012-11-22 VITALS — BP 126/80 | HR 97 | Temp 98.3°F | Wt 286.0 lb

## 2012-11-22 DIAGNOSIS — I10 Essential (primary) hypertension: Secondary | ICD-10-CM

## 2012-11-22 DIAGNOSIS — M1990 Unspecified osteoarthritis, unspecified site: Secondary | ICD-10-CM

## 2012-11-22 DIAGNOSIS — R739 Hyperglycemia, unspecified: Secondary | ICD-10-CM

## 2012-11-22 DIAGNOSIS — E785 Hyperlipidemia, unspecified: Secondary | ICD-10-CM

## 2012-11-22 DIAGNOSIS — R7309 Other abnormal glucose: Secondary | ICD-10-CM

## 2012-11-22 DIAGNOSIS — J309 Allergic rhinitis, unspecified: Secondary | ICD-10-CM

## 2012-11-22 DIAGNOSIS — Z1231 Encounter for screening mammogram for malignant neoplasm of breast: Secondary | ICD-10-CM

## 2012-11-22 LAB — CBC WITH DIFFERENTIAL/PLATELET
Basophils Relative: 0.6 % (ref 0.0–3.0)
Eosinophils Absolute: 0.3 10*3/uL (ref 0.0–0.7)
Hemoglobin: 14.3 g/dL (ref 12.0–15.0)
MCHC: 34.7 g/dL (ref 30.0–36.0)
MCV: 87.4 fl (ref 78.0–100.0)
Monocytes Absolute: 0.6 10*3/uL (ref 0.1–1.0)
Neutro Abs: 5.3 10*3/uL (ref 1.4–7.7)
Neutrophils Relative %: 63 % (ref 43.0–77.0)
RBC: 4.71 Mil/uL (ref 3.87–5.11)

## 2012-11-22 LAB — POCT URINALYSIS DIPSTICK
Bilirubin, UA: NEGATIVE
Blood, UA: NEGATIVE
Nitrite, UA: NEGATIVE
Protein, UA: NEGATIVE
pH, UA: 5.5

## 2012-11-22 LAB — BASIC METABOLIC PANEL
CO2: 22 mEq/L (ref 19–32)
Chloride: 104 mEq/L (ref 96–112)
GFR: 102.82 mL/min (ref >60.00)
Glucose, Bld: 124 mg/dL — ABNORMAL HIGH (ref 70–99)
Potassium: 3.7 mEq/L (ref 3.5–5.1)
Sodium: 135 mEq/L (ref 135–145)

## 2012-11-22 LAB — HEPATIC FUNCTION PANEL
ALT: 58 U/L — ABNORMAL HIGH (ref 0–35)
AST: 44 U/L — ABNORMAL HIGH (ref 0–37)
Albumin: 3.9 g/dL (ref 3.5–5.2)
Alkaline Phosphatase: 61 U/L (ref 39–117)

## 2012-11-22 LAB — LIPID PANEL
Cholesterol: 176 mg/dL (ref 0–200)
VLDL: 30 mg/dL (ref 0.0–40.0)

## 2012-11-22 MED ORDER — LISINOPRIL-HYDROCHLOROTHIAZIDE 10-12.5 MG PO TABS: 1.0000 | ORAL_TABLET | Freq: Every day | ORAL | Status: DC

## 2012-11-22 MED ORDER — SIMVASTATIN 40 MG PO TABS: 40.0000 mg | ORAL_TABLET | Freq: Every day | ORAL | Status: DC

## 2012-11-22 MED ORDER — IBUPROFEN 800 MG PO TABS: 800.0000 mg | ORAL_TABLET | Freq: Four times a day (QID) | ORAL | Status: DC | PRN

## 2012-11-22 MED ORDER — FLUTICASONE PROPIONATE 50 MCG/ACT NA SUSP: 2.0000 | Freq: Every day | NASAL | Status: DC

## 2012-11-22 NOTE — Progress Notes (Signed)
  Subjective:    Patient ID: Teresa Dawson, female    DOB: 1960-12-08, 52 y.o.   MRN: [id number]  HPI Here to follow up. She asks me to check her left knee. About 3 months ago she slipped on ice and fell, twisting the knee. It hurt quite a bit at first but it has slowly improved ever since. It never swelled. It does not lock or give way. She has very little pain at this point. She admit to getting no exercise and to overeating. She has gained 20 lbs since last year.    Review of Systems  Constitutional: Negative.   Respiratory: Negative.   Cardiovascular: Negative.        Objective:   Physical Exam  Constitutional:  Obese   Neck: No thyromegaly present.  Cardiovascular: Normal rate, regular rhythm, normal heart sounds and intact distal pulses.   Pulmonary/Chest: Effort normal and breath sounds normal.  Musculoskeletal:  The left knee has some crepitus but is not tender at all. Full ROM   Lymphadenopathy:    She has no cervical adenopathy.          Assessment & Plan:  We will get fasting labs today. She seems to have sprained the knee but it has healed. She needs to lose weight, and we discussed diet and exercise.

## 2012-11-23 ENCOUNTER — Other Ambulatory Visit: Payer: Self-pay | Admitting: Family Medicine

## 2012-11-23 DIAGNOSIS — R928 Other abnormal and inconclusive findings on diagnostic imaging of breast: Secondary | ICD-10-CM

## 2012-11-25 NOTE — Progress Notes (Signed)
Quick Note:  Called and spoke with pt and pt is aware. Labs mailed to pt per request. ______

## 2012-12-02 ENCOUNTER — Ambulatory Visit
Admission: RE | Admit: 2012-12-02 | Discharge: 2012-12-02 | Disposition: A | Discharge status: Home / Self Care | Payer: BC Managed Care – PPO | Source: Ambulatory Visit | Attending: Family Medicine | Admitting: Family Medicine

## 2012-12-02 DIAGNOSIS — R928 Other abnormal and inconclusive findings on diagnostic imaging of breast: Secondary | ICD-10-CM

## 2013-03-10 ENCOUNTER — Telehealth: Payer: Self-pay | Admitting: Family Medicine

## 2013-03-10 NOTE — Telephone Encounter (Signed)
This needs to be discussed at an OV

## 2013-03-10 NOTE — Telephone Encounter (Signed)
Pt has heard about some new meds on TV and would like to know if they would be approriate:Wants appetite suppressant for her: Belviq Qsymia If so, she would like them called into her pharm PHARM: [facility] [address] Pt refused appt, stating she was in not long ago. (11/22/12)

## 2013-03-13 NOTE — Telephone Encounter (Signed)
I spoke with pt and she is going to schedule a office visit. 

## 2013-04-04 ENCOUNTER — Ambulatory Visit: Payer: BC Managed Care – PPO | Admitting: Family Medicine

## 2013-05-17 ENCOUNTER — Other Ambulatory Visit: Payer: Self-pay | Admitting: Family Medicine

## 2013-05-17 DIAGNOSIS — R921 Mammographic calcification found on diagnostic imaging of breast: Secondary | ICD-10-CM

## 2013-07-11 ENCOUNTER — Ambulatory Visit
Admission: RE | Admit: 2013-07-11 | Discharge: 2013-07-11 | Disposition: A | Discharge status: Home / Self Care | Payer: BC Managed Care – PPO | Source: Ambulatory Visit | Attending: Family Medicine | Admitting: Family Medicine

## 2013-07-11 DIAGNOSIS — R921 Mammographic calcification found on diagnostic imaging of breast: Secondary | ICD-10-CM

## 2013-11-07 ENCOUNTER — Telehealth: Payer: Self-pay | Admitting: Family Medicine

## 2013-11-07 NOTE — Telephone Encounter (Signed)
Pt hurt leg a week ago and it is still bothering her pretty bad.  Pt states she twisted leg on a [place] walkway and its not getting better. Pt would like appt tomorrow am, if not then, fri afternoon is the only other time. Pt refused another provider. pls advise if ok to schedule.

## 2013-11-07 NOTE — Telephone Encounter (Signed)
Pt sch for fri

## 2013-11-07 NOTE — Telephone Encounter (Signed)
Dr. Sarajane Jews is out of the office tomorrow afternoon. She can schedule for another day.

## 2013-11-10 ENCOUNTER — Ambulatory Visit: Payer: BC Managed Care – PPO | Admitting: Family Medicine

## 2013-11-15 ENCOUNTER — Other Ambulatory Visit: Payer: Self-pay | Admitting: Family Medicine

## 2013-11-15 DIAGNOSIS — R921 Mammographic calcification found on diagnostic imaging of breast: Secondary | ICD-10-CM

## 2013-11-16 ENCOUNTER — Other Ambulatory Visit: Payer: Self-pay | Admitting: Family Medicine

## 2013-11-16 ENCOUNTER — Other Ambulatory Visit: Payer: Self-pay

## 2013-11-16 DIAGNOSIS — R921 Mammographic calcification found on diagnostic imaging of breast: Secondary | ICD-10-CM

## 2013-11-30 ENCOUNTER — Other Ambulatory Visit: Payer: Self-pay | Admitting: Family Medicine

## 2013-12-27 ENCOUNTER — Other Ambulatory Visit: Payer: BC Managed Care – PPO

## 2013-12-28 ENCOUNTER — Ambulatory Visit
Admission: RE | Admit: 2013-12-28 | Discharge: 2013-12-28 | Disposition: A | Discharge status: Home / Self Care | Payer: BC Managed Care – PPO | Source: Ambulatory Visit | Attending: Family Medicine | Admitting: Family Medicine

## 2013-12-28 ENCOUNTER — Other Ambulatory Visit (INDEPENDENT_AMBULATORY_CARE_PROVIDER_SITE_OTHER): Payer: BC Managed Care – PPO

## 2013-12-28 DIAGNOSIS — Z0000 Encounter for general adult medical examination without abnormal findings: Secondary | ICD-10-CM

## 2013-12-28 DIAGNOSIS — R921 Mammographic calcification found on diagnostic imaging of breast: Secondary | ICD-10-CM

## 2013-12-28 LAB — HEPATIC FUNCTION PANEL
ALBUMIN: 3.7 g/dL (ref 3.5–5.2)
ALT: 46 U/L — AB (ref 0–35)
AST: 35 U/L (ref 0–37)
Alkaline Phosphatase: 59 U/L (ref 39–117)
BILIRUBIN TOTAL: 0.7 mg/dL (ref 0.2–1.2)
Bilirubin, Direct: 0.1 mg/dL (ref 0.0–0.3)
Total Protein: 6.7 g/dL (ref 6.0–8.3)

## 2013-12-28 LAB — CBC WITH DIFFERENTIAL/PLATELET
BASOS PCT: 0.6 % (ref 0.0–3.0)
Basophils Absolute: 0 10*3/uL (ref 0.0–0.1)
EOS ABS: 0.2 10*3/uL (ref 0.0–0.7)
EOS PCT: 2.5 % (ref 0.0–5.0)
HEMATOCRIT: 41.1 % (ref 36.0–46.0)
HEMOGLOBIN: 14 g/dL (ref 12.0–15.0)
Lymphocytes Relative: 33 % (ref 12.0–46.0)
Lymphs Abs: 2.4 10*3/uL (ref 0.7–4.0)
MCHC: 34 g/dL (ref 30.0–36.0)
MCV: 87.5 fl (ref 78.0–100.0)
MONO ABS: 0.4 10*3/uL (ref 0.1–1.0)
Monocytes Relative: 6.2 % (ref 3.0–12.0)
NEUTROS ABS: 4.1 10*3/uL (ref 1.4–7.7)
Neutrophils Relative %: 57.7 % (ref 43.0–77.0)
Platelets: 238 10*3/uL (ref 150.0–400.0)
RBC: 4.7 Mil/uL (ref 3.87–5.11)
RDW: 14.2 % (ref 11.5–15.5)
WBC: 7.2 10*3/uL (ref 4.0–10.5)

## 2013-12-28 LAB — BASIC METABOLIC PANEL
BUN: 12 mg/dL (ref 6–23)
CHLORIDE: 107 meq/L (ref 96–112)
CO2: 26 meq/L (ref 19–32)
Calcium: 9.3 mg/dL (ref 8.4–10.5)
Creatinine, Ser: 0.6 mg/dL (ref 0.4–1.2)
GFR: 102.4 mL/min (ref >60.00)
Glucose, Bld: 105 mg/dL — ABNORMAL HIGH (ref 70–99)
POTASSIUM: 3.8 meq/L (ref 3.5–5.1)
SODIUM: 139 meq/L (ref 135–145)

## 2013-12-28 LAB — POCT URINALYSIS DIPSTICK
Bilirubin, UA: NEGATIVE
GLUCOSE UA: NEGATIVE
Ketones, UA: NEGATIVE
Leukocytes, UA: NEGATIVE
NITRITE UA: NEGATIVE
PROTEIN UA: NEGATIVE
RBC UA: NEGATIVE
SPEC GRAV UA: 1.02
UROBILINOGEN UA: 0.2
pH, UA: 6

## 2013-12-28 LAB — LIPID PANEL
CHOL/HDL RATIO: 4
Cholesterol: 176 mg/dL (ref 0–200)
HDL: 46.7 mg/dL (ref >39.00)
LDL CALC: 106 mg/dL — AB (ref 0–99)
NONHDL: 129.3
TRIGLYCERIDES: 116 mg/dL (ref 0.0–149.0)
VLDL: 23.2 mg/dL (ref 0.0–40.0)

## 2013-12-28 LAB — TSH: TSH: 1.99 u[IU]/mL (ref 0.35–4.50)

## 2014-01-03 ENCOUNTER — Other Ambulatory Visit: Payer: BC Managed Care – PPO

## 2014-01-03 ENCOUNTER — Encounter: Payer: BC Managed Care – PPO | Admitting: Family Medicine

## 2014-01-03 ENCOUNTER — Encounter: Payer: Self-pay | Admitting: Family Medicine

## 2014-01-03 ENCOUNTER — Ambulatory Visit (INDEPENDENT_AMBULATORY_CARE_PROVIDER_SITE_OTHER): Payer: BC Managed Care – PPO | Admitting: Family Medicine

## 2014-01-03 VITALS — BP 120/80 | Temp 98.5°F | Ht 64.0 in | Wt 283.0 lb

## 2014-01-03 DIAGNOSIS — B351 Tinea unguium: Secondary | ICD-10-CM

## 2014-01-03 DIAGNOSIS — Z0000 Encounter for general adult medical examination without abnormal findings: Secondary | ICD-10-CM

## 2014-01-03 MED ORDER — LISINOPRIL-HYDROCHLOROTHIAZIDE 10-12.5 MG PO TABS: ORAL_TABLET | ORAL | Status: DC

## 2014-01-03 MED ORDER — TERBINAFINE HCL 250 MG PO TABS: 250.0000 mg | ORAL_TABLET | Freq: Every day | ORAL | Status: DC

## 2014-01-03 MED ORDER — IBUPROFEN 800 MG PO TABS: ORAL_TABLET | ORAL | Status: DC

## 2014-01-03 MED ORDER — SIMVASTATIN 40 MG PO TABS: ORAL_TABLET | ORAL | Status: DC

## 2014-01-03 MED ORDER — BUPROPION HCL ER (XL) 150 MG PO TB24: 150.0000 mg | ORAL_TABLET | Freq: Every day | ORAL | Status: DC

## 2014-01-03 NOTE — Progress Notes (Signed)
   Subjective:    Patient ID: Teresa Dawson, female    DOB: 01/26/1961, 53 y.o.   MRN: [id number]  HPI 53 yr old female for a cpx. She has a few things to discuss. She asks if we can give her an appetite suppressant because she can be a compulsive eater and she knows she is overweight. She also ask about help with mild depression. She has been depressed off and on for years and this causes sadness and lack of motivation. She sleeps well. She tried Prozac about 10 years ago briefly but stopped it due to insomnia. Also she asks to treat her toenail fungus which started about 5 years ago.    Review of Systems  Constitutional: Negative.   HENT: Negative.   Eyes: Negative.   Respiratory: Negative.   Cardiovascular: Negative.   Gastrointestinal: Negative.   Genitourinary: Negative for dysuria, urgency, frequency, hematuria, flank pain, decreased urine volume, enuresis, difficulty urinating, pelvic pain and dyspareunia.  Musculoskeletal: Negative.   Skin: Negative.   Neurological: Negative.   Psychiatric/Behavioral: Positive for dysphoric mood. Negative for suicidal ideas, hallucinations, behavioral problems, confusion, sleep disturbance, self-injury, decreased concentration and agitation. The patient is nervous/anxious. The patient is not hyperactive.        Objective:   Physical Exam  Constitutional: She is oriented to person, place, and time. She appears well-developed and well-nourished. No distress.  HENT:  Head: Normocephalic and atraumatic.  Right Ear: External ear normal.  Left Ear: External ear normal.  Nose: Nose normal.  Mouth/Throat: Oropharynx is clear and moist. No oropharyngeal exudate.  Eyes: Conjunctivae and EOM are normal. Pupils are equal, round, and reactive to light. No scleral icterus.  Neck: Normal range of motion. Neck supple. No JVD present. No thyromegaly present.  Cardiovascular: Normal rate, regular rhythm, normal heart sounds and intact distal pulses.  Exam  reveals no gallop and no friction rub.   No murmur heard. Pulmonary/Chest: Effort normal and breath sounds normal. No respiratory distress. She has no wheezes. She has no rales. She exhibits no tenderness.  Abdominal: Soft. Bowel sounds are normal. She exhibits no distension and no mass. There is no tenderness. There is no rebound and no guarding.  Musculoskeletal: Normal range of motion. She exhibits no edema and no tenderness.  Lymphadenopathy:    She has no cervical adenopathy.  Neurological: She is alert and oriented to person, place, and time. She has normal reflexes. No cranial nerve deficit. She exhibits normal muscle tone. Coordination normal.  Skin: Skin is warm and dry. No rash noted. No erythema.  All ten toenails show thickening and yellowing   Psychiatric: She has a normal mood and affect. Her behavior is normal. Judgment and thought content normal.          Assessment & Plan:  Well exam. Start on Wellbutrin XL 150 mg daily and we will recheck in one month. I told her this has mild appetite suppressant qualities. She needs to exercise. Try Terbinafine for the toenails. Set up a colonoscopy.

## 2014-01-03 NOTE — Progress Notes (Signed)
Pre visit review using our clinic review tool, if applicable. No additional management support is needed unless otherwise documented below in the visit note. 

## 2014-01-10 ENCOUNTER — Other Ambulatory Visit: Payer: BC Managed Care – PPO

## 2014-01-16 ENCOUNTER — Encounter: Payer: Self-pay | Admitting: Internal Medicine

## 2014-01-17 ENCOUNTER — Other Ambulatory Visit: Payer: BC Managed Care – PPO

## 2014-01-22 ENCOUNTER — Telehealth: Payer: Self-pay | Admitting: Family Medicine

## 2014-01-22 NOTE — Telephone Encounter (Signed)
Pt is needing a referral to get a diagnostic mammogram, [facility] [place]. Pt states she felt a lump and wants to get it checked out.

## 2014-01-22 NOTE — Telephone Encounter (Signed)
appt scheduled

## 2014-01-22 NOTE — Telephone Encounter (Signed)
Per Dr. Sarajane Jews pt needs a office visit to evaluate first. Can you call pt to schedule this?

## 2014-01-23 ENCOUNTER — Encounter: Payer: BC Managed Care – PPO | Admitting: Family Medicine

## 2014-01-23 ENCOUNTER — Telehealth: Payer: Self-pay | Admitting: *Deleted

## 2014-01-23 NOTE — Telephone Encounter (Signed)
Pt left message requesting a referral to the [facility] so that she can get a diagnostic Mammo. She feels she has a cyst on the Lt breast. (Pt's last appt in this office was 11/27/10)  I called pt back after chart review. I stated that we had received her message and that I see she has scheduled and appt with her PCP- Dr. Sarajane Jews for tomorrow to have her breast evaluated. She stated yes but she would prefer to be referred by our practice since we are her Gyn doctors and she "doesn't want to start having her breast issues evaluated by her PCP. Also they are requiring me to come in for a visit and I just need the referral. I advised pt that our doctors would want to see her first prior to making the referral as well and our office does not have any available appts until at least next week. Pt seemed annoyed and asked if that is because of insurance. I stated that it is because a doctor is responsible for her care and through evaluation of her problem is able to make proper recommendation of the appropriate tests to be done. Pt also made comments as to her confusion about Mammo results from last month. I advised her to discuss with Dr. Sarajane Jews tomorrow as he ordered that test. Pt voiced understanding and will keep her appt as scheduled with Dr. Sarajane Jews.

## 2014-01-24 ENCOUNTER — Encounter: Payer: Self-pay | Admitting: Family Medicine

## 2014-01-24 ENCOUNTER — Ambulatory Visit (INDEPENDENT_AMBULATORY_CARE_PROVIDER_SITE_OTHER): Payer: BC Managed Care – PPO | Admitting: Family Medicine

## 2014-01-24 ENCOUNTER — Encounter: Payer: BC Managed Care – PPO | Admitting: Family Medicine

## 2014-01-24 VITALS — BP 130/80 | Temp 99.3°F | Ht 64.0 in | Wt 286.0 lb

## 2014-01-24 DIAGNOSIS — N6089 Other benign mammary dysplasias of unspecified breast: Secondary | ICD-10-CM

## 2014-01-24 DIAGNOSIS — N6082 Other benign mammary dysplasias of left breast: Secondary | ICD-10-CM

## 2014-01-24 NOTE — Progress Notes (Signed)
Pre visit review using our clinic review tool, if applicable. No additional management support is needed unless otherwise documented below in the visit note. 

## 2014-01-24 NOTE — Progress Notes (Signed)
   Subjective:    Patient ID: Teresa Dawson, female    DOB: 11-19-1960, 53 y.o.   MRN: [id number]  HPI Here to check a non-tender lump in the left breast which she discovered 2 days ago. She had a normal mammogram last month.   Review of Systems  Constitutional: Negative.        Objective:   Physical Exam  Constitutional: She appears well-developed and well-nourished.  Genitourinary:  The left breast has an inflamed sebaceous cyst at the 6 o'clock position but no masses in the breast parenchyma. No axillary nodes           Assessment & Plan:  Inflamed sebaceous cyst. Use warm compresses. Recheck prn

## 2014-02-07 ENCOUNTER — Ambulatory Visit (INDEPENDENT_AMBULATORY_CARE_PROVIDER_SITE_OTHER): Payer: BC Managed Care – PPO | Admitting: Family Medicine

## 2014-02-07 ENCOUNTER — Encounter: Payer: Self-pay | Admitting: Family Medicine

## 2014-02-07 VITALS — BP 128/76 | Temp 98.7°F | Ht 64.0 in | Wt 283.0 lb

## 2014-02-07 DIAGNOSIS — F329 Major depressive disorder, single episode, unspecified: Secondary | ICD-10-CM

## 2014-02-07 DIAGNOSIS — F3289 Other specified depressive episodes: Secondary | ICD-10-CM

## 2014-02-07 MED ORDER — PHENTERMINE HCL 37.5 MG PO CAPS: 37.5000 mg | ORAL_CAPSULE | ORAL | Status: DC

## 2014-02-07 NOTE — Progress Notes (Signed)
   Subjective:    Patient ID: Teresa Dawson, female    DOB: 01/23/1961, 53 y.o.   MRN: [id number]  HPI Here to follow up. She has been taking Wellbutrin XL 150 mg for about 6 weeks and she is very pleased with how it has helped her depression. She has better moods and things do not upset her like before. No side effects. However this has not helped her compulsive eating at all.    Review of Systems  Constitutional: Negative.   Respiratory: Negative.   Cardiovascular: Negative.   Neurological: Negative.   Psychiatric/Behavioral: Negative.        Objective:   Physical Exam  Constitutional: She appears well-developed and well-nourished.  Psychiatric: She has a normal mood and affect. Her behavior is normal. Thought content normal.          Assessment & Plan:  We will stay on the Wellbutrin as is but will add Phentermine to suppress the appetite. Recheck one month

## 2014-02-07 NOTE — Progress Notes (Signed)
Pre visit review using our clinic review tool, if applicable. No additional management support is needed unless otherwise documented below in the visit note. 

## 2014-03-06 ENCOUNTER — Telehealth: Payer: Self-pay | Admitting: Family Medicine

## 2014-03-06 NOTE — Telephone Encounter (Signed)
Patient Information:  Caller Name: Jermiya  Phone: [phone]  Patient: Teresa Dawson, Teresa Dawson  Gender: Female  DOB: 01/11/1961  Age: 53 Years  PCP: Alysia Penna [facility])  Pregnant: No  Office Follow Up:  Does the office need to follow up with this patient?: Yes  Instructions For The Office: Disposition: Discuss with PCP and callback by nurse today.   Please follow up with pt regarding her photosensitivity with Lamisil   Symptoms  Reason For Call & Symptoms: Pt states she has been on Lamisil for 2 months for her toenail infections.   She is seeing no improvement so far, but pt has realized she has been getting sunburned to chest area within very short amt of times (ex: driving in car, walking in to house).   The sunburn is always to chest area only.  Pt spoke with pharmacists and he states that the Lamisil can cause photosensitivity.  Reviewed Health History In EMR: Yes  Reviewed Medications In EMR: Yes  Reviewed Allergies In EMR: Yes  Reviewed Surgeries / Procedures: Yes  Date of Onset of Symptoms: 03/06/2014 OB / GYN:  LMP: Unknown  Guideline(s) Used:  Sunburn  Disposition Per Guideline:   Discuss with PCP and Callback by Nurse Today  Reason For Disposition Reached:   Sunburn following brief sun exposure and taking photosensitizing drug (e.g., tetracycline, doxycycline, elidel, protopic, griseofulvin, sulfa drugs)  Advice Given:  Hydrocortisone Cream:  Keep the cream in the refrigerator (Reason: it feels better if applied cold).  Cool Baths:  Apply cool compresses to the burned area several times a day to reduce pain and burning. For larger sunburns, give cool baths for 10 minutes (Caution: avoid any chill). Add 2 oz (60 gms) baking soda per tub. Avoid soap on the sunburn.  Extra Fluids:  Drink extra water on the first day to replace the fluids lost into the sunburn and to prevent dehydration and dizziness.  Patient Will Follow Care Advice:  YES

## 2014-03-06 NOTE — Telephone Encounter (Signed)
Caller: Savonna/Patient; Phone: [phone]; Reason for Call: Patient states she missed call from office; requests call to her cell phone and please leave voice mail if she is not able to answer at [phone].  Thank you.

## 2014-03-06 NOTE — Telephone Encounter (Signed)
I spoke with pt and she stated that she is scheduled to see Dr. Sarajane Jews next week and she will discuss this issue at that time.

## 2014-03-06 NOTE — Telephone Encounter (Signed)
Per Dr. Sarajane Jews, pt can stop the medication Lamisil since she is not seeing much improvement or she can use the sun block and stay out of the sun as much as possible.

## 2014-03-14 ENCOUNTER — Ambulatory Visit (INDEPENDENT_AMBULATORY_CARE_PROVIDER_SITE_OTHER): Payer: BC Managed Care – PPO | Admitting: Family Medicine

## 2014-03-14 ENCOUNTER — Ambulatory Visit: Payer: BC Managed Care – PPO | Admitting: Family Medicine

## 2014-03-14 ENCOUNTER — Encounter: Payer: Self-pay | Admitting: Family Medicine

## 2014-03-14 DIAGNOSIS — F329 Major depressive disorder, single episode, unspecified: Secondary | ICD-10-CM

## 2014-03-14 DIAGNOSIS — F3289 Other specified depressive episodes: Secondary | ICD-10-CM

## 2014-03-14 DIAGNOSIS — B351 Tinea unguium: Secondary | ICD-10-CM

## 2014-03-14 DIAGNOSIS — I10 Essential (primary) hypertension: Secondary | ICD-10-CM

## 2014-03-14 MED ORDER — TERBINAFINE HCL 250 MG PO TABS: 250.0000 mg | ORAL_TABLET | Freq: Every day | ORAL | Status: DC

## 2014-03-14 NOTE — Progress Notes (Signed)
Pre visit review using our clinic review tool, if applicable. No additional management support is needed unless otherwise documented below in the visit note. 

## 2014-03-14 NOTE — Progress Notes (Signed)
   Subjective:    Patient ID: Teresa Dawson, female    DOB: Dec 13, 1960, 53 y.o.   MRN: [id number]  HPI Here to follow up. She feels fine and the Phentermine seems to be helping. She is down 2 lbs today from the last visit. Her moods are stable.    Review of Systems  Constitutional: Negative.   Respiratory: Negative.   Cardiovascular: Negative.   Neurological: Negative.   Psychiatric/Behavioral: Negative.        Objective:   Physical Exam  Constitutional: She is oriented to person, place, and time. She appears well-nourished.  Cardiovascular: Normal rate, regular rhythm, normal heart sounds and intact distal pulses.   Pulmonary/Chest: Effort normal and breath sounds normal.  Neurological: She is alert and oriented to person, place, and time.  Psychiatric: She has a normal mood and affect. Her behavior is normal. Thought content normal.          Assessment & Plan:  We will continue the current plan and recheck in 3 months.

## 2014-04-02 ENCOUNTER — Ambulatory Visit (AMBULATORY_SURGERY_CENTER): Payer: Self-pay

## 2014-04-02 VITALS — Ht 64.0 in | Wt 278.6 lb

## 2014-04-02 DIAGNOSIS — Z1211 Encounter for screening for malignant neoplasm of colon: Secondary | ICD-10-CM

## 2014-04-02 NOTE — Progress Notes (Signed)
PT NEVER STARTED PHENTERAMINE  ANXIOUS ABOUT HAVING PROCEDURE DUE TO PERFORATED COLON OF AUNT DURING COLONOSCOPY  No allergies to eggs or soy No home oxygen No past problems with anesthesia  Has email  Emmi instructions given for colonoscopy

## 2014-04-10 ENCOUNTER — Encounter: Payer: Self-pay | Admitting: Internal Medicine

## 2014-04-16 ENCOUNTER — Encounter: Payer: Self-pay | Admitting: Internal Medicine

## 2014-04-16 ENCOUNTER — Ambulatory Visit (AMBULATORY_SURGERY_CENTER): Payer: BC Managed Care – PPO | Admitting: Internal Medicine

## 2014-04-16 VITALS — BP 116/61 | HR 71 | Temp 96.6°F | Resp 11 | Ht 64.0 in | Wt 278.0 lb

## 2014-04-16 DIAGNOSIS — Z1211 Encounter for screening for malignant neoplasm of colon: Secondary | ICD-10-CM

## 2014-04-16 DIAGNOSIS — D126 Benign neoplasm of colon, unspecified: Secondary | ICD-10-CM

## 2014-04-16 DIAGNOSIS — Z860101 Personal history of adenomatous and serrated colon polyps: Secondary | ICD-10-CM

## 2014-04-16 DIAGNOSIS — Z86010 Personal history of colonic polyps: Secondary | ICD-10-CM | POA: Insufficient documentation

## 2014-04-16 HISTORY — PX: COLONOSCOPY: SHX174

## 2014-04-16 HISTORY — DX: Personal history of adenomatous and serrated colon polyps: Z86.0101

## 2014-04-16 HISTORY — DX: Personal history of colonic polyps: Z86.010

## 2014-04-16 MED ORDER — SODIUM CHLORIDE 0.9 % IV SOLN: 500.0000 mL | INTRAVENOUS | Status: DC

## 2014-04-16 NOTE — Op Note (Signed)
[facility]  [address] [place], [address]   COLONOSCOPY PROCEDURE REPORT  PATIENT: Teresa Dawson, Teresa Dawson  MR#: [id number] BIRTHDATE: [birth year] , 29  yrs. old GENDER: female ENDOSCOPIST: Gatha Mayer, MD, [facility] PROCEDURE DATE:  04/16/2014 PROCEDURE:   Colonoscopy with biopsy and Colonoscopy with snare polypectomy First Screening Colonoscopy - Avg.  risk and is 50 yrs.  old or older Yes.  Prior Negative Screening - Now for repeat screening. N/A  History of Adenoma - Now for follow-up colonoscopy & has been > or = to 3 yrs.  N/A  Polyps Removed Today? No.  Recommend repeat exam, <10 yrs? ASA CLASS:   Class III INDICATIONS:average risk for colorectal cancer and first colonoscopy. MEDICATIONS: Propofol 350 mg IV and Monitored anesthesia care  DESCRIPTION OF PROCEDURE:   After the risks benefits and alternatives of the procedure were thoroughly explained, informed consent was obtained.  The digital rectal exam revealed no abnormalities of the rectum.   The LB [id number]  endoscope was introduced through the anus and advanced to the cecum, which was identified by both the appendix and ileocecal valve. No adverse events experienced.   The quality of the prep was good, using MiraLax  The instrument was then slowly withdrawn as the colon was fully examined.      COLON FINDINGS: Five sessile polyps ranging from 2 to 59mm in size were found at the hepatic flexure, in the ascending colon, and at the cecum.  A polypectomy was performed with cold forceps on the 2 mm cecal polyp..  The resection was complete, the polyp tissue was completely retrieved and sent to histology.  A polypectomy was performed with a cold snare on the other polyps.  The resection was complete, the polyp tissue was completely retrieved and sent to histology.   The colon mucosa was otherwise normal.   Right colon retroflexion included.  Retroflexed views revealed no abnormalities. The time to  cecum=2 minutes 29 seconds.  Withdrawal time=18 minutes 01 seconds.  The scope was withdrawn and the procedure completed. COMPLICATIONS: There were no immediate complications.  ENDOSCOPIC IMPRESSION: 1.   Five sessile polyps ranging from 2 to 29mm in size were found at the hepatic flexure, in the ascending colon, and at the cecum; polypectomy was performed with cold forceps; polypectomy was performed with a cold snare 2.   The colon mucosa was otherwise normal 3.   Right colon retroflexion included  RECOMMENDATIONS: Timing of repeat colonoscopy will be determined by pathology findings.  eSigned:  Gatha Mayer, MD, [facility] 04/16/2014 11:19 AM   cc: Alysia Penna, MD and The Patient   PATIENT NAME:  Teresa Dawson, Teresa Dawson MR#: [id number]

## 2014-04-16 NOTE — Progress Notes (Signed)
Report to PACU, RN, vss, BBS= Clear.  

## 2014-04-16 NOTE — Patient Instructions (Addendum)
I found and removed 5 small polyps that all look benign.  I will let you know pathology results and when to have another routine colonoscopy by mail.  I appreciate the opportunity to care for you. Gatha Mayer, MD, FACG    YOU HAD AN ENDOSCOPIC PROCEDURE TODAY AT [facility]: Refer to the procedure report that was given to you for any specific questions about what was found during the examination.  If the procedure report does not answer your questions, please call your gastroenterologist to clarify.  If you requested that your care partner not be given the details of your procedure findings, then the procedure report has been included in a sealed envelope for you to review at your convenience later.  YOU SHOULD EXPECT: Some feelings of bloating in the abdomen. Passage of more gas than usual.  Walking can help get rid of the air that was put into your GI tract during the procedure and reduce the bloating. If you had a lower endoscopy (such as a colonoscopy or flexible sigmoidoscopy) you may notice spotting of blood in your stool or on the toilet paper. If you underwent a bowel prep for your procedure, then you may not have a normal bowel movement for a few days.  DIET: Your first meal following the procedure should be a light meal and then it is ok to progress to your normal diet.  A half-sandwich or bowl of soup is an example of a good first meal.  Heavy or fried foods are harder to digest and may make you feel nauseous or bloated.  Likewise meals heavy in dairy and vegetables can cause extra gas to form and this can also increase the bloating.  Drink plenty of fluids but you should avoid alcoholic beverages for 24 hours.  ACTIVITY: Your care partner should take you home directly after the procedure.  You should plan to take it easy, moving slowly for the rest of the day.  You can resume normal activity the day after the procedure however you should NOT DRIVE or use heavy  machinery for 24 hours (because of the sedation medicines used during the test).    SYMPTOMS TO REPORT IMMEDIATELY: A gastroenterologist can be reached at any hour.  During normal business hours, 8:30 AM to 5:00 PM Monday through Friday, call [phone].  After hours and on weekends, please call the GI answering service at [phone] who will take a message and have the physician on call contact you.   Following lower endoscopy (colonoscopy or flexible sigmoidoscopy):  Excessive amounts of blood in the stool  Significant tenderness or worsening of abdominal pains  Swelling of the abdomen that is new, acute  Fever of 100F or higher   FOLLOW UP: If any biopsies were taken you will be contacted by phone or by letter within the next 1-3 weeks.  Call your gastroenterologist if you have not heard about the biopsies in 3 weeks.  Our staff will call the home number listed on your records the next business day following your procedure to check on you and address any questions or concerns that you may have at that time regarding the information given to you following your procedure. This is a courtesy call and so if there is no answer at the home number and we have not heard from you through the emergency physician on call, we will assume that you have returned to your regular daily activities without incident.  SIGNATURES/CONFIDENTIALITY: You and/or  your care partner have signed paperwork which will be entered into your electronic medical record.  These signatures attest to the fact that that the information above on your After Visit Summary has been reviewed and is understood.  Full responsibility of the confidentiality of this discharge information lies with you and/or your care-partner.   Resume medications. Information sealed with discharge instructions and polyps.

## 2014-04-16 NOTE — Progress Notes (Signed)
Called to room to assist during endoscopic procedure.  Patient ID and intended procedure confirmed with present staff. Received instructions for my participation in the procedure from the performing physician.  

## 2014-04-17 ENCOUNTER — Telehealth: Payer: Self-pay | Admitting: *Deleted

## 2014-04-17 NOTE — Telephone Encounter (Signed)
  Follow up Call-  Call back number 04/16/2014  Post procedure Call Back phone  # [phone]  Permission to leave phone message Yes     No answer at # given.  Left message on voicemail.

## 2014-04-24 ENCOUNTER — Encounter: Payer: Self-pay | Admitting: Internal Medicine

## 2014-04-24 NOTE — Progress Notes (Signed)
Quick Note:  2- 7 mm polyps, 5 total, adenomas, sp - repeat colon 2018 ______

## 2014-06-06 ENCOUNTER — Ambulatory Visit: Payer: BC Managed Care – PPO | Admitting: Family Medicine

## 2014-12-11 ENCOUNTER — Telehealth: Payer: Self-pay | Admitting: Family Medicine

## 2014-12-11 MED ORDER — SIMVASTATIN 40 MG PO TABS: ORAL_TABLET | ORAL | Status: DC

## 2014-12-11 MED ORDER — LISINOPRIL-HYDROCHLOROTHIAZIDE 10-12.5 MG PO TABS: ORAL_TABLET | ORAL | Status: DC

## 2014-12-11 NOTE — Telephone Encounter (Signed)
Pt request refill of the following: lisinopril-hydrochlorothiazide (PRINZIDE,ZESTORETIC) 10-12.5 MG per tablet  simvastatin (ZOCOR) 40 MG tablet     Phamacy: [facility]

## 2014-12-11 NOTE — Telephone Encounter (Signed)
I sent both scripts e-scribe. 

## 2015-01-24 ENCOUNTER — Encounter: Payer: Self-pay | Admitting: Family Medicine

## 2015-02-21 ENCOUNTER — Other Ambulatory Visit: Payer: Self-pay

## 2015-02-21 ENCOUNTER — Other Ambulatory Visit: Payer: Self-pay | Admitting: Family Medicine

## 2015-02-21 ENCOUNTER — Telehealth: Payer: Self-pay | Admitting: Family Medicine

## 2015-02-21 DIAGNOSIS — R921 Mammographic calcification found on diagnostic imaging of breast: Secondary | ICD-10-CM

## 2015-02-21 MED ORDER — SIMVASTATIN 40 MG PO TABS: ORAL_TABLET | ORAL | Status: DC

## 2015-02-21 MED ORDER — LISINOPRIL-HYDROCHLOROTHIAZIDE 10-12.5 MG PO TABS: ORAL_TABLET | ORAL | Status: DC

## 2015-02-21 NOTE — Telephone Encounter (Signed)
Pt does not know which lisinopril she  is taking ?lisinopril 20 mg or lisinopril  - hctz

## 2015-02-21 NOTE — Telephone Encounter (Signed)
Need to confirm refill request for Lisinopril? There is 2 different medications in pt's chart.

## 2015-02-21 NOTE — Telephone Encounter (Signed)
done

## 2015-02-21 NOTE — Telephone Encounter (Signed)
Patient called for a RX refill on Simvastatin 40 mg tablet and Lisinopril 20 mg tablet sent to [facility]. Please give patient a call when the RX has been called in.

## 2015-03-05 ENCOUNTER — Telehealth: Payer: Self-pay | Admitting: Family Medicine

## 2015-03-05 NOTE — Telephone Encounter (Signed)
lmom for pt to be here to see dr fry tomorrow at 2pm

## 2015-03-05 NOTE — Telephone Encounter (Signed)
Pt is aware of appt

## 2015-03-05 NOTE — Telephone Encounter (Signed)
Pt has an appt sch w/cory tomorrow but prefers to see dr fry. Can I use sda slot for tomorrow

## 2015-03-05 NOTE — Telephone Encounter (Signed)
Please put pt on Dr. Barbie Banner schedule.

## 2015-03-06 ENCOUNTER — Encounter: Payer: Self-pay | Admitting: Family Medicine

## 2015-03-06 ENCOUNTER — Ambulatory Visit (INDEPENDENT_AMBULATORY_CARE_PROVIDER_SITE_OTHER): Payer: BLUE CROSS/BLUE SHIELD | Admitting: Family Medicine

## 2015-03-06 ENCOUNTER — Ambulatory Visit: Payer: Self-pay | Admitting: Adult Health

## 2015-03-06 VITALS — BP 114/76 | HR 107 | Temp 98.6°F | Ht 64.0 in | Wt 275.0 lb

## 2015-03-06 DIAGNOSIS — S8000XA Contusion of unspecified knee, initial encounter: Secondary | ICD-10-CM | POA: Diagnosis not present

## 2015-03-06 MED ORDER — HYDROCODONE-ACETAMINOPHEN 10-325 MG PO TABS: 1.0000 | ORAL_TABLET | Freq: Four times a day (QID) | ORAL | Status: DC | PRN

## 2015-03-06 NOTE — Progress Notes (Signed)
   Subjective:    Patient ID: Teresa Dawson, female    DOB: September 25, 1960, 54 y.o.   MRN: [id number]  HPI Here for injuries sustained on 03-04-15 when she tripped over a garden hose and fell forward on both knees. She has a lot of bruising on the knees, more so on the left. She has mild pain when walking. She is able to drive. Taking 800 mg of Ibuprofen.    Review of Systems  Constitutional: Negative.   Musculoskeletal: Positive for arthralgias and gait problem.       Objective:   Physical Exam  Constitutional: She appears well-developed and well-nourished.  Walks easily   Musculoskeletal:  Bot knees show ecchymoses, much worse over the left anterior knee. No swelling. Full ROM, no crepitus.           Assessment & Plan:  Knee contusions. Use ice packs and try Vicodin prn.

## 2015-03-06 NOTE — Progress Notes (Signed)
Pre visit review using our clinic review tool, if applicable. No additional management support is needed unless otherwise documented below in the visit note. 

## 2015-03-08 ENCOUNTER — Other Ambulatory Visit: Payer: Self-pay | Admitting: Family Medicine

## 2015-03-08 ENCOUNTER — Telehealth: Payer: Self-pay | Admitting: Family Medicine

## 2015-03-08 MED ORDER — IBUPROFEN 800 MG PO TABS: 800.0000 mg | ORAL_TABLET | Freq: Four times a day (QID) | ORAL | Status: DC | PRN

## 2015-03-08 NOTE — Telephone Encounter (Signed)
Pt request refill of the following: ibuprofen (ADVIL,MOTRIN) 800 MG tablet  Pt said she can not take  HYDROcodone-acetaminophen (NORCO) 10-325 MG per tablet DOING THE DAY  it makes her drowsy and is asking if she can have the above rx to take doing the day   Phamacy: [facility]

## 2015-03-08 NOTE — Telephone Encounter (Signed)
rx sent to pharmacy and patient is aware.

## 2015-03-08 NOTE — Telephone Encounter (Signed)
Call in Ibuprofen 800 mg every 6 hours prn pain, #120 with 5 rf

## 2015-04-12 ENCOUNTER — Other Ambulatory Visit: Payer: Self-pay

## 2015-04-12 ENCOUNTER — Other Ambulatory Visit: Payer: Self-pay | Admitting: Family Medicine

## 2015-04-12 DIAGNOSIS — R921 Mammographic calcification found on diagnostic imaging of breast: Secondary | ICD-10-CM

## 2015-04-17 ENCOUNTER — Ambulatory Visit (INDEPENDENT_AMBULATORY_CARE_PROVIDER_SITE_OTHER): Payer: BLUE CROSS/BLUE SHIELD | Admitting: Family Medicine

## 2015-04-17 ENCOUNTER — Ambulatory Visit
Admission: RE | Admit: 2015-04-17 | Discharge: 2015-04-17 | Disposition: A | Discharge status: Home / Self Care | Payer: BLUE CROSS/BLUE SHIELD | Source: Ambulatory Visit | Attending: Family Medicine | Admitting: Family Medicine

## 2015-04-17 ENCOUNTER — Encounter: Payer: Self-pay | Admitting: Family Medicine

## 2015-04-17 ENCOUNTER — Other Ambulatory Visit: Payer: Self-pay | Admitting: Family Medicine

## 2015-04-17 VITALS — BP 111/74 | HR 87 | Temp 98.4°F | Ht 64.0 in | Wt 276.0 lb

## 2015-04-17 DIAGNOSIS — R921 Mammographic calcification found on diagnostic imaging of breast: Secondary | ICD-10-CM

## 2015-04-17 DIAGNOSIS — F329 Major depressive disorder, single episode, unspecified: Secondary | ICD-10-CM | POA: Diagnosis not present

## 2015-04-17 DIAGNOSIS — Z0000 Encounter for general adult medical examination without abnormal findings: Secondary | ICD-10-CM

## 2015-04-17 DIAGNOSIS — N6310 Unspecified lump in the right breast, unspecified quadrant: Secondary | ICD-10-CM

## 2015-04-17 LAB — BASIC METABOLIC PANEL
BUN: 12 mg/dL (ref 6–23)
CO2: 27 meq/L (ref 19–32)
CREATININE: 0.61 mg/dL (ref 0.40–1.20)
Calcium: 9.5 mg/dL (ref 8.4–10.5)
Chloride: 105 mEq/L (ref 96–112)
GFR: 107.72 mL/min (ref >60.00)
Glucose, Bld: 122 mg/dL — ABNORMAL HIGH (ref 70–99)
POTASSIUM: 3.9 meq/L (ref 3.5–5.1)
Sodium: 140 mEq/L (ref 135–145)

## 2015-04-17 LAB — CBC WITH DIFFERENTIAL/PLATELET
BASOS ABS: 0 10*3/uL (ref 0.0–0.1)
BASOS PCT: 0.4 % (ref 0.0–3.0)
EOS ABS: 0.4 10*3/uL (ref 0.0–0.7)
Eosinophils Relative: 4.6 % (ref 0.0–5.0)
HEMATOCRIT: 41.4 % (ref 36.0–46.0)
HEMOGLOBIN: 14 g/dL (ref 12.0–15.0)
LYMPHS PCT: 24.5 % (ref 12.0–46.0)
Lymphs Abs: 2 10*3/uL (ref 0.7–4.0)
MCHC: 33.8 g/dL (ref 30.0–36.0)
MCV: 87.2 fl (ref 78.0–100.0)
MONO ABS: 0.5 10*3/uL (ref 0.1–1.0)
Monocytes Relative: 6 % (ref 3.0–12.0)
Neutro Abs: 5.2 10*3/uL (ref 1.4–7.7)
Neutrophils Relative %: 64.5 % (ref 43.0–77.0)
Platelets: 275 10*3/uL (ref 150.0–400.0)
RBC: 4.75 Mil/uL (ref 3.87–5.11)
RDW: 13.4 % (ref 11.5–15.5)
WBC: 8 10*3/uL (ref 4.0–10.5)

## 2015-04-17 LAB — POCT URINALYSIS DIPSTICK
BILIRUBIN UA: NEGATIVE
Glucose, UA: NEGATIVE
Ketones, UA: NEGATIVE
NITRITE UA: NEGATIVE
PH UA: 6.5
PROTEIN UA: NEGATIVE
RBC UA: NEGATIVE
Spec Grav, UA: 1.02
UROBILINOGEN UA: 0.2

## 2015-04-17 LAB — HEPATIC FUNCTION PANEL
ALBUMIN: 4.1 g/dL (ref 3.5–5.2)
ALT: 33 U/L (ref 0–35)
AST: 25 U/L (ref 0–37)
Alkaline Phosphatase: 61 U/L (ref 39–117)
BILIRUBIN TOTAL: 0.6 mg/dL (ref 0.2–1.2)
Bilirubin, Direct: 0.1 mg/dL (ref 0.0–0.3)
TOTAL PROTEIN: 6.6 g/dL (ref 6.0–8.3)

## 2015-04-17 LAB — LIPID PANEL
CHOL/HDL RATIO: 4
Cholesterol: 191 mg/dL (ref 0–200)
HDL: 47.9 mg/dL (ref >39.00)
LDL Cholesterol: 110 mg/dL — ABNORMAL HIGH (ref 0–99)
NonHDL: 143.51
TRIGLYCERIDES: 168 mg/dL — AB (ref 0.0–149.0)
VLDL: 33.6 mg/dL (ref 0.0–40.0)

## 2015-04-17 LAB — TSH: TSH: 1.45 u[IU]/mL (ref 0.35–4.50)

## 2015-04-17 MED ORDER — BUPROPION HCL ER (XL) 300 MG PO TB24: 300.0000 mg | ORAL_TABLET | Freq: Every day | ORAL | Status: DC

## 2015-04-17 MED ORDER — TRIAMCINOLONE ACETONIDE 0.1 % EX CREA: 1.0000 "application " | TOPICAL_CREAM | Freq: Two times a day (BID) | CUTANEOUS | Status: DC

## 2015-04-17 NOTE — Progress Notes (Signed)
   Subjective:    Patient ID: Teresa Dawson, female    DOB: 1961/02/25, 54 y.o.   MRN: [id number]  HPI 54 yr old female for a cpx. She has a few items to discuss. First she as had an itchy rash on the left lower leg for about 6 months. She is using moisturizer lotions with little response. Second she asks if the Wellbutrin dose can be increased. She has had more sadness and lack of motivation lately. She sleeps well.    Review of Systems  Constitutional: Negative.   HENT: Negative.   Eyes: Negative.   Respiratory: Negative.   Cardiovascular: Negative.   Gastrointestinal: Negative.   Genitourinary: Negative for dysuria, urgency, frequency, hematuria, flank pain, decreased urine volume, enuresis, difficulty urinating, pelvic pain and dyspareunia.  Musculoskeletal: Negative.   Skin: Positive for rash. Negative for color change, pallor and wound.  Neurological: Negative.   Psychiatric/Behavioral: Positive for dysphoric mood. Negative for suicidal ideas, hallucinations, behavioral problems, confusion, sleep disturbance, self-injury, decreased concentration and agitation. The patient is nervous/anxious.        Objective:   Physical Exam  Constitutional: She is oriented to person, place, and time. No distress.  Morbidly obese   HENT:  Head: Normocephalic and atraumatic.  Right Ear: External ear normal.  Left Ear: External ear normal.  Nose: Nose normal.  Mouth/Throat: Oropharynx is clear and moist. No oropharyngeal exudate.  Eyes: Conjunctivae and EOM are normal. Pupils are equal, round, and reactive to light. No scleral icterus.  Neck: Normal range of motion. Neck supple. No JVD present. No thyromegaly present.  Cardiovascular: Normal rate, regular rhythm, normal heart sounds and intact distal pulses.  Exam reveals no gallop and no friction rub.   No murmur heard. EKG normal   Pulmonary/Chest: Effort normal and breath sounds normal. No respiratory distress. She has no wheezes. She has  no rales. She exhibits no tenderness.  Abdominal: Soft. Bowel sounds are normal. She exhibits no distension and no mass. There is no tenderness. There is no rebound and no guarding.  Musculoskeletal: Normal range of motion. She exhibits no edema or tenderness.  Lymphadenopathy:    She has no cervical adenopathy.  Neurological: She is alert and oriented to person, place, and time. She has normal reflexes. No cranial nerve deficit. She exhibits normal muscle tone. Coordination normal.  Skin: Skin is warm and dry. No erythema.  The left lower anterior leg has broad areas of macular red scaly skin   Psychiatric: She has a normal mood and affect. Her behavior is normal. Judgment and thought content normal.          Assessment & Plan:  Well exam. We will increase the Wellbutrin XL to 300 mg daily for the depression. Try Triamcinolone cream for the eczema. Get fasting labs.

## 2015-04-17 NOTE — Progress Notes (Signed)
Pre visit review using our clinic review tool, if applicable. No additional management support is needed unless otherwise documented below in the visit note. 

## 2015-04-23 ENCOUNTER — Telehealth: Payer: Self-pay | Admitting: Family Medicine

## 2015-04-23 NOTE — Telephone Encounter (Signed)
Pt would like blood work results °

## 2015-04-23 NOTE — Telephone Encounter (Signed)
Called and spoke with pt about lab results.  See result note.  

## 2015-04-24 ENCOUNTER — Ambulatory Visit
Admission: RE | Admit: 2015-04-24 | Discharge: 2015-04-24 | Disposition: A | Discharge status: Home / Self Care | Payer: BLUE CROSS/BLUE SHIELD | Source: Ambulatory Visit | Attending: Family Medicine | Admitting: Family Medicine

## 2015-04-24 ENCOUNTER — Other Ambulatory Visit: Payer: Self-pay | Admitting: Family Medicine

## 2015-04-24 DIAGNOSIS — N6310 Unspecified lump in the right breast, unspecified quadrant: Secondary | ICD-10-CM

## 2015-04-25 ENCOUNTER — Telehealth: Payer: Self-pay | Admitting: Family Medicine

## 2015-04-25 NOTE — Telephone Encounter (Signed)
[facility] Client [place] Patient Name: Teresa Dawson DOB: Nov 08, 1960 Initial Comment Caller states she had a biopsy from her breast taken. They injected some medication in her breast. She started having some mild chest pain. The pain has subsided. But is still concerned. Nurse Assessment Nurse: Germain Osgood RN, Opal Sidles Date/Time Eilene Ghazi Time): 04/25/2015 5:08:04 PM Confirm and document reason for call. If symptomatic, describe symptoms. ---Caller states she had breast biopsy, had numbing agent, had procedure and 30 minutes into drive home developed tightness in center of chest. Had eaten hot dog with onions. States pain lasted quite a while after home. Reports did not feel clammy. Reports pain last 20 minutes to 20 minutes. Pain level varied. No recent cardiac history. Has the patient traveled out of the country within the last 30 days? ---No Does the patient have any new or worsening symptoms? ---No Please document clinical information provided and list any resource used. ---Caller reports she has no symptoms now and none since pain resolved last PM. Does take BP med. Guidelines Guideline Title Affirmed Question Affirmed Notes Chest Pain Chest pain lasts > 5 minutes (Exceptions: chest pain occurring > 3 days ago and now asymptomatic; same as previously diagnosed heartburn and has accompanying sour taste in mouth) Final Disposition User Go to ED Now (or PCP triage) Germain Osgood, RN, Jane Referrals [facility] REFUSED Disagree/Comply: Disagree Disagree/Comply Reason: Disagree with instructions

## 2015-04-26 NOTE — Telephone Encounter (Signed)
I spoke with pt and she is not having any pain at all right now. She is actually at work today and did agree to go to ER if she starts having any more symptoms at all. I will forward to Dr. Sarajane Jews for review.

## 2015-04-26 NOTE — Telephone Encounter (Signed)
I agree, she can come in if needed

## 2015-04-26 NOTE — Telephone Encounter (Signed)
I left a voice message for pt to return my call.  

## 2015-04-26 NOTE — Telephone Encounter (Signed)
FYI, please review.

## 2015-05-08 ENCOUNTER — Telehealth: Payer: Self-pay | Admitting: Family Medicine

## 2015-05-08 NOTE — Telephone Encounter (Signed)
Pt had root canal yesterday and now has vertigo. Pt has tried otc dramamine. Pt would like prescription strength call into [facility] in [place]

## 2015-05-08 NOTE — Telephone Encounter (Signed)
Pt following up on med request.

## 2015-05-08 NOTE — Telephone Encounter (Signed)
Call in Meclizine 25 mg to take every 4 hours prn dizziness, #60 with 2 rf 

## 2015-05-09 MED ORDER — MECLIZINE HCL 25 MG PO TABS: 25.0000 mg | ORAL_TABLET | ORAL | Status: DC | PRN

## 2015-05-09 NOTE — Telephone Encounter (Signed)
[name] cna you call this in? It was approved yesterday and pt has been waiting, still dizzy  [facility] / [employer]

## 2015-05-09 NOTE — Telephone Encounter (Signed)
Rx called in to [facility].  Called to make pt aware; left a message rx sent to pharmacy.

## 2015-05-10 ENCOUNTER — Telehealth: Payer: Self-pay | Admitting: Family Medicine

## 2015-05-10 NOTE — Telephone Encounter (Signed)
Pt calling to report she was seen at [facility] for your screening mammogram and that she was informed she had a breast nodule that needed to be biopsied.  Pt was states biopsy results were negative but that she was referred to Dr. Fanny Skates at [facility] for a consultation for a lumpectomy.  Pt states she did not have a good experience with Dr. Dalbert Batman and wants to know if Dr. Sarajane Jews would recommend another surgeon at [facility] she could see as she would not be returning to see Dr. Dalbert Batman.  Pt states she justs wants to know why the are recommending a lumpectomy if her biopsy results are negative.

## 2015-05-13 NOTE — Telephone Encounter (Signed)
I understand but she would not be allowed to see another doctor within the same group. That is the typical protocol. If she prefers, I could refer her to see another surgeon to review this outside of [facility]. Let me know what she decides

## 2015-05-14 NOTE — Telephone Encounter (Signed)
I spoke with pt and if she decides to get a referral for another provider, she will contact our office.

## 2015-06-20 HISTORY — PX: BREAST LUMPECTOMY: SHX2

## 2016-03-09 ENCOUNTER — Other Ambulatory Visit: Payer: Self-pay | Admitting: Family Medicine

## 2016-03-20 ENCOUNTER — Telehealth: Payer: Self-pay | Admitting: Family Medicine

## 2016-03-20 NOTE — Telephone Encounter (Signed)
Patient Name: Teresa Dawson  DOB: 1961-05-12    Initial Comment Has been having itching all over body since yesterday . unable to pin point the cause.    Nurse Assessment  Nurse: Raphael Gibney, RN, Vera Date/Time (Eastern Time): 03/20/2016 12:31:04 PM  Confirm and document reason for call. If symptomatic, describe symptoms. You must click the next button to save text entered. ---Caller states she is having itching all over her body since yesterday morning. Unable to pinpoint the cause. no rash or insect bites. Has used anti itch cream which helped a little.  Has the patient traveled out of the country within the last 30 days? ---Not Applicable  Does the patient have any new or worsening symptoms? ---Yes  Will a triage be completed? ---Yes  Related visit to physician within the last 2 weeks? ---No  Does the PT have any chronic conditions? (i.e. diabetes, asthma, etc.) ---Yes  List chronic conditions. ---HTN  Is this a behavioral health or substance abuse call? ---No     Guidelines    Guideline Title Affirmed Question Affirmed Notes  Itching - Widespread [1] Itching of unknown cause AND [2] present < 48 hours (all triage questions negative)    Final Disposition User   [place], RN, Vanita Ingles    Disagree/Comply: Comply

## 2016-03-20 NOTE — Telephone Encounter (Signed)
FYI!  Home Care Advise Given.  Care Advice Given Per Guideline MOISTURIZE THE SKIN WITH LOTION: * The best time to apply lotion is right after a bath or shower when the skin is moist. The lotion or cream will help seal in the moisture. HOME CARE: You should be able to treat this at home. REASSURANCE: * Avoid all strong soaps (including bubble bath and scented soaps). (Reason: soaps remove natural oils from the skin.) Use gentler soaps like Dove, Olay, or Basis. AVOID SOAPS: ANTIHISTAMINE FOR SEVERE ITCHING: * Take an antihistamine by mouth to reduce the itching. Diphenhydramine (OTC Benadryl) is a good choice. Adult dose is 25-50 mg. Take it up to 4 times a day. COOL BATH FOR FLARE-UP: * For flare-ups of itching, take a cool bath without soap for 15 minutes, 1-2 times per day. Pat dry using towel - do not rub. * Another option is an Database administrator Designer, television/film set) [employer]. Sprinkle contents of one packet under running faucet. (Caution: this can make bathtub slippery.) CALL BACK IF: * You become worse. CARE ADVICE given per Itching, Widespread (Adult) guideline. * Rash occurs * Itching becomes worse or persists over 48 hours * Eucerin lotion, Lubriderm lotion, or Vaseline Intensive Care lotion all work well.

## 2016-03-20 NOTE — Telephone Encounter (Signed)
Home care advice was given by team health.

## 2016-04-09 ENCOUNTER — Other Ambulatory Visit: Payer: Self-pay | Admitting: Family Medicine

## 2016-04-09 NOTE — Telephone Encounter (Signed)
Pt need new Rx for simvastatin   Pharm:  [facility]  [employer]

## 2016-04-10 MED ORDER — SIMVASTATIN 40 MG PO TABS: ORAL_TABLET | ORAL | 0 refills | Status: DC

## 2016-04-10 NOTE — Telephone Encounter (Signed)
Rx sent in

## 2016-04-14 ENCOUNTER — Other Ambulatory Visit: Payer: BLUE CROSS/BLUE SHIELD

## 2016-04-20 ENCOUNTER — Encounter: Payer: BLUE CROSS/BLUE SHIELD | Admitting: Family Medicine

## 2016-06-05 ENCOUNTER — Telehealth: Payer: Self-pay | Admitting: Family Medicine

## 2016-06-05 NOTE — Telephone Encounter (Signed)
Pt would like to add an A1C to her cpx labs. Is that OK?

## 2016-06-08 ENCOUNTER — Other Ambulatory Visit: Payer: Self-pay | Admitting: Family Medicine

## 2016-06-08 DIAGNOSIS — R739 Hyperglycemia, unspecified: Secondary | ICD-10-CM

## 2016-06-08 NOTE — Telephone Encounter (Signed)
Pt is aware.  

## 2016-06-08 NOTE — Telephone Encounter (Signed)
Per Dr. Sarajane Jews okay to order. I put in future order and left a voice message for pt.

## 2016-06-10 ENCOUNTER — Other Ambulatory Visit: Payer: Self-pay | Admitting: Family Medicine

## 2016-06-16 ENCOUNTER — Other Ambulatory Visit: Payer: BLUE CROSS/BLUE SHIELD

## 2016-06-18 ENCOUNTER — Other Ambulatory Visit: Payer: BLUE CROSS/BLUE SHIELD

## 2016-06-19 ENCOUNTER — Other Ambulatory Visit: Payer: BLUE CROSS/BLUE SHIELD

## 2016-06-22 ENCOUNTER — Encounter: Payer: BLUE CROSS/BLUE SHIELD | Admitting: Family Medicine

## 2016-06-24 ENCOUNTER — Encounter: Payer: BLUE CROSS/BLUE SHIELD | Admitting: Family Medicine

## 2016-07-03 ENCOUNTER — Other Ambulatory Visit: Payer: BLUE CROSS/BLUE SHIELD

## 2016-07-03 ENCOUNTER — Other Ambulatory Visit (INDEPENDENT_AMBULATORY_CARE_PROVIDER_SITE_OTHER): Payer: BLUE CROSS/BLUE SHIELD

## 2016-07-03 DIAGNOSIS — Z0000 Encounter for general adult medical examination without abnormal findings: Secondary | ICD-10-CM | POA: Diagnosis not present

## 2016-07-03 DIAGNOSIS — R739 Hyperglycemia, unspecified: Secondary | ICD-10-CM

## 2016-07-03 LAB — POC URINALSYSI DIPSTICK (AUTOMATED)
Bilirubin, UA: NEGATIVE
GLUCOSE UA: NEGATIVE
Ketones, UA: NEGATIVE
Leukocytes, UA: NEGATIVE
Nitrite, UA: NEGATIVE
SPEC GRAV UA: 1.02
UROBILINOGEN UA: 0.2
pH, UA: 6

## 2016-07-03 LAB — CBC WITH DIFFERENTIAL/PLATELET
BASOS PCT: 0.6 % (ref 0.0–3.0)
Basophils Absolute: 0.1 10*3/uL (ref 0.0–0.1)
EOS ABS: 0.2 10*3/uL (ref 0.0–0.7)
Eosinophils Relative: 2.3 % (ref 0.0–5.0)
HEMATOCRIT: 40.7 % (ref 36.0–46.0)
Hemoglobin: 14 g/dL (ref 12.0–15.0)
LYMPHS ABS: 2.2 10*3/uL (ref 0.7–4.0)
Lymphocytes Relative: 25.7 % (ref 12.0–46.0)
MCHC: 34.5 g/dL (ref 30.0–36.0)
MCV: 85.7 fl (ref 78.0–100.0)
MONO ABS: 0.5 10*3/uL (ref 0.1–1.0)
Monocytes Relative: 6.3 % (ref 3.0–12.0)
NEUTROS ABS: 5.5 10*3/uL (ref 1.4–7.7)
Neutrophils Relative %: 65.1 % (ref 43.0–77.0)
PLATELETS: 242 10*3/uL (ref 150.0–400.0)
RBC: 4.75 Mil/uL (ref 3.87–5.11)
RDW: 13.4 % (ref 11.5–15.5)
WBC: 8.5 10*3/uL (ref 4.0–10.5)

## 2016-07-03 LAB — HEPATIC FUNCTION PANEL
ALT: 32 U/L (ref 0–35)
AST: 24 U/L (ref 0–37)
Albumin: 4 g/dL (ref 3.5–5.2)
Alkaline Phosphatase: 65 U/L (ref 39–117)
BILIRUBIN DIRECT: 0.1 mg/dL (ref 0.0–0.3)
TOTAL PROTEIN: 6.4 g/dL (ref 6.0–8.3)
Total Bilirubin: 0.5 mg/dL (ref 0.2–1.2)

## 2016-07-03 LAB — HEMOGLOBIN A1C: Hgb A1c MFr Bld: 6.6 % — ABNORMAL HIGH (ref 4.6–6.5)

## 2016-07-03 LAB — BASIC METABOLIC PANEL
BUN: 9 mg/dL (ref 6–23)
CHLORIDE: 105 meq/L (ref 96–112)
CO2: 25 meq/L (ref 19–32)
CREATININE: 0.67 mg/dL (ref 0.40–1.20)
Calcium: 9.2 mg/dL (ref 8.4–10.5)
GFR: 96.25 mL/min (ref >60.00)
GLUCOSE: 136 mg/dL — AB (ref 70–99)
Potassium: 3.9 mEq/L (ref 3.5–5.1)
Sodium: 141 mEq/L (ref 135–145)

## 2016-07-03 LAB — TSH: TSH: 1.85 u[IU]/mL (ref 0.35–4.50)

## 2016-07-03 LAB — LIPID PANEL
CHOL/HDL RATIO: 3
Cholesterol: 173 mg/dL (ref 0–200)
HDL: 51.6 mg/dL (ref >39.00)
LDL Cholesterol: 94 mg/dL (ref 0–99)
NonHDL: 121.23
TRIGLYCERIDES: 137 mg/dL (ref 0.0–149.0)
VLDL: 27.4 mg/dL (ref 0.0–40.0)

## 2016-07-06 ENCOUNTER — Ambulatory Visit (INDEPENDENT_AMBULATORY_CARE_PROVIDER_SITE_OTHER): Payer: BLUE CROSS/BLUE SHIELD | Admitting: Family Medicine

## 2016-07-06 ENCOUNTER — Encounter: Payer: Self-pay | Admitting: Family Medicine

## 2016-07-06 VITALS — BP 122/80 | Temp 98.6°F | Ht 64.0 in | Wt 290.0 lb

## 2016-07-06 DIAGNOSIS — Z0000 Encounter for general adult medical examination without abnormal findings: Secondary | ICD-10-CM | POA: Diagnosis not present

## 2016-07-06 DIAGNOSIS — E669 Obesity, unspecified: Secondary | ICD-10-CM

## 2016-07-06 DIAGNOSIS — E1169 Type 2 diabetes mellitus with other specified complication: Secondary | ICD-10-CM | POA: Insufficient documentation

## 2016-07-06 MED ORDER — SIMVASTATIN 40 MG PO TABS
ORAL_TABLET | ORAL | 3 refills | Status: DC
Start: 2016-07-06 — End: 2017-07-09

## 2016-07-06 MED ORDER — LISINOPRIL-HYDROCHLOROTHIAZIDE 10-12.5 MG PO TABS: 1.0000 | ORAL_TABLET | Freq: Every day | ORAL | 3 refills | Status: DC

## 2016-07-06 MED ORDER — HYDROCODONE-ACETAMINOPHEN 10-325 MG PO TABS: 1.0000 | ORAL_TABLET | Freq: Four times a day (QID) | ORAL | 0 refills | Status: DC | PRN

## 2016-07-06 MED ORDER — BUPROPION HCL ER (XL) 300 MG PO TB24: 300.0000 mg | ORAL_TABLET | Freq: Every day | ORAL | 3 refills | Status: DC

## 2016-07-06 NOTE — Progress Notes (Signed)
   Subjective:    Patient ID: Teresa Dawson, female    DOB: 02-14-1961, 55 y.o.   MRN: [id number]  HPI 55 yr old female for a well exam. She feels well except for chronic left knee pain. She has been seeing a therapist in [place], [place] for PRP injections to the knee. She has received about 7 of these and they seem to be helping. She actually plans to move to [place], [place] sometime next year. Her recent lbas show the A1c has been steadily increasing and now it is 6.6.    Review of Systems  Constitutional: Negative.   HENT: Negative.   Eyes: Negative.   Respiratory: Negative.   Cardiovascular: Negative.   Gastrointestinal: Negative.   Genitourinary: Negative for decreased urine volume, difficulty urinating, dyspareunia, dysuria, enuresis, flank pain, frequency, hematuria, pelvic pain and urgency.  Musculoskeletal: Positive for arthralgias. Negative for back pain, gait problem, joint swelling, myalgias, neck pain and neck stiffness.  Skin: Negative.   Neurological: Negative.   Psychiatric/Behavioral: Negative.        Objective:   Physical Exam  Constitutional: She is oriented to person, place, and time. No distress.  Obese   HENT:  Head: Normocephalic and atraumatic.  Right Ear: External ear normal.  Left Ear: External ear normal.  Nose: Nose normal.  Mouth/Throat: Oropharynx is clear and moist. No oropharyngeal exudate.  Eyes: Conjunctivae and EOM are normal. Pupils are equal, round, and reactive to light. No scleral icterus.  Neck: Normal range of motion. Neck supple. No JVD present. No thyromegaly present.  Cardiovascular: Normal rate, regular rhythm, normal heart sounds and intact distal pulses.  Exam reveals no gallop and no friction rub.   No murmur heard. Pulmonary/Chest: Effort normal and breath sounds normal. No respiratory distress. She has no wheezes. She has no rales. She exhibits no tenderness.  Abdominal: Soft. Bowel sounds are normal. She exhibits no distension and  no mass. There is no tenderness. There is no rebound and no guarding.  Musculoskeletal: Normal range of motion. She exhibits no edema or tenderness.  Lymphadenopathy:    She has no cervical adenopathy.  Neurological: She is alert and oriented to person, place, and time. She has normal reflexes. No cranial nerve deficit. She exhibits normal muscle tone. Coordination normal.  Skin: Skin is warm and dry. No rash noted. No erythema.  Psychiatric: She has a normal mood and affect. Her behavior is normal. Judgment and thought content normal.          Assessment & Plan:  Well exam. We discussed diet and exercise. She now has type 2 DM, and I will refer her to Nutrition to work on this. Recheck an A1c in 90 days.  Laurey Morale, MD

## 2016-07-06 NOTE — Progress Notes (Signed)
Pre visit review using our clinic review tool, if applicable. No additional management support is needed unless otherwise documented below in the visit note. 

## 2016-07-16 ENCOUNTER — Telehealth: Payer: Self-pay | Admitting: Family Medicine

## 2016-07-16 NOTE — Telephone Encounter (Signed)
Refill request, Ketoprofen 50 mg is no longer available, there is a ER 200 mg. Can you send in a new script?

## 2016-07-17 MED ORDER — KETOPROFEN ER 200 MG PO CP24: 200.0000 mg | ORAL_CAPSULE | Freq: Every day | ORAL | 3 refills | Status: DC

## 2016-07-17 NOTE — Telephone Encounter (Signed)
I sent script e-scribe to [facility] and spoke with pt, updated medication list.

## 2016-07-17 NOTE — Telephone Encounter (Signed)
Switch Ketoprofen to ER 200 mg daily, #90 with 3 rf

## 2016-07-23 ENCOUNTER — Telehealth: Payer: Self-pay | Admitting: Family Medicine

## 2016-07-23 NOTE — Telephone Encounter (Signed)
Received a fax from [employer].  Ketoprofen 200 mg ER capsules are not covered.  Ok to switch to a different medication?

## 2016-07-24 NOTE — Telephone Encounter (Signed)
She should contact her insurance company to find out what NSAIDs they will cover. Then give [place] the list to pick from

## 2016-07-27 NOTE — Telephone Encounter (Signed)
I spoke with pt and she did pick up the Ketoprofen 50 mg, she will contact insurance company before this script runs out and find out what they will cover.

## 2016-08-11 ENCOUNTER — Encounter: Payer: BLUE CROSS/BLUE SHIELD | Attending: Family Medicine | Admitting: Dietician

## 2016-08-11 DIAGNOSIS — E1169 Type 2 diabetes mellitus with other specified complication: Secondary | ICD-10-CM | POA: Diagnosis not present

## 2016-08-11 DIAGNOSIS — E669 Obesity, unspecified: Secondary | ICD-10-CM | POA: Insufficient documentation

## 2016-08-11 DIAGNOSIS — Z6841 Body Mass Index (BMI) 40.0 and over, adult: Secondary | ICD-10-CM | POA: Diagnosis not present

## 2016-08-11 DIAGNOSIS — Z713 Dietary counseling and surveillance: Secondary | ICD-10-CM | POA: Insufficient documentation

## 2016-08-14 NOTE — Progress Notes (Signed)

## 2016-08-18 ENCOUNTER — Ambulatory Visit: Payer: BLUE CROSS/BLUE SHIELD

## 2016-08-25 ENCOUNTER — Encounter: Payer: BLUE CROSS/BLUE SHIELD | Attending: Family Medicine | Admitting: *Deleted

## 2016-08-25 ENCOUNTER — Ambulatory Visit: Payer: BLUE CROSS/BLUE SHIELD

## 2016-08-25 DIAGNOSIS — Z713 Dietary counseling and surveillance: Secondary | ICD-10-CM | POA: Diagnosis present

## 2016-08-25 DIAGNOSIS — E1169 Type 2 diabetes mellitus with other specified complication: Secondary | ICD-10-CM | POA: Insufficient documentation

## 2016-08-25 DIAGNOSIS — Z6841 Body Mass Index (BMI) 40.0 and over, adult: Secondary | ICD-10-CM | POA: Diagnosis not present

## 2016-08-25 DIAGNOSIS — E669 Obesity, unspecified: Secondary | ICD-10-CM | POA: Insufficient documentation

## 2016-08-25 NOTE — Progress Notes (Signed)

## 2016-09-01 ENCOUNTER — Ambulatory Visit: Payer: BLUE CROSS/BLUE SHIELD

## 2016-09-02 MED FILL — KETOPROFEN 50 MG CAPSULE: 50 | 37 days supply | Qty: 110 | Fill #0

## 2016-09-03 DIAGNOSIS — E1169 Type 2 diabetes mellitus with other specified complication: Secondary | ICD-10-CM

## 2016-09-03 DIAGNOSIS — E669 Obesity, unspecified: Principal | ICD-10-CM

## 2016-09-03 NOTE — Progress Notes (Signed)
Patient was seen on 09/03/16 for the third of a series of three diabetes self-management courses at the [facility].   Catalina Gravel the amount of activity recommended for healthy living . Describe activities suitable for individual needs . Identify ways to regularly incorporate activity into daily life . Identify barriers to activity and ways to over come these barriers  Identify diabetes medications being personally used and their primary action for lowering glucose and possible side effects . Describe role of stress on blood glucose and develop strategies to address psychosocial issues . Identify diabetes complications and ways to prevent them  Explain how to manage diabetes during illness . Evaluate success in meeting personal goal . Establish 2-3 goals that they will plan to diligently work on until they return for the  62-month follow-up visit  Goals:   I will count my carb choices at most meals and snacks  I will be active 30 minutes or more 5 times a week  I will eat less unhealthy fats  Your patient has identified these potential barriers to change:  None  Your patient has identified their diabetes self-care support plan as  [facility] Support Group On-line Resources Plan:  Attend Support Group as desired

## 2016-11-16 ENCOUNTER — Telehealth: Payer: Self-pay | Admitting: Family Medicine

## 2016-11-16 NOTE — Telephone Encounter (Signed)
Pt need new Rx for ketoprofen 50 MG (#110)   Pharm: [facility]  [phone]  Pt state that she will be in [place] and want Dr. Sarajane Jews to request 110 due to the fact that this medication is not found anywhere but in [place] at this present time and they only have 110 in stock.

## 2016-11-17 MED ORDER — KETOPROFEN 50 MG PO CAPS: 50.0000 mg | ORAL_CAPSULE | Freq: Four times a day (QID) | ORAL | 0 refills | Status: DC | PRN

## 2016-11-17 NOTE — Telephone Encounter (Signed)
Pt is aware that the Rx was called in to the [facility]

## 2016-11-17 NOTE — Telephone Encounter (Signed)
I called in script to below number and left a voice message for pt with this information.

## 2016-11-17 NOTE — Telephone Encounter (Signed)
Call in #110 as requested

## 2017-02-23 ENCOUNTER — Other Ambulatory Visit: Payer: Self-pay | Admitting: Family Medicine

## 2017-02-23 NOTE — Telephone Encounter (Signed)
Can we refill this? 

## 2017-03-02 ENCOUNTER — Encounter: Payer: Self-pay | Admitting: Family Medicine

## 2017-03-02 ENCOUNTER — Ambulatory Visit (INDEPENDENT_AMBULATORY_CARE_PROVIDER_SITE_OTHER): Payer: BLUE CROSS/BLUE SHIELD | Admitting: Family Medicine

## 2017-03-02 VITALS — BP 124/76 | Temp 98.2°F | Ht 64.0 in | Wt 297.0 lb

## 2017-03-02 DIAGNOSIS — M79641 Pain in right hand: Secondary | ICD-10-CM | POA: Diagnosis not present

## 2017-03-02 NOTE — Progress Notes (Signed)
   Subjective:    Patient ID: Teresa Dawson, female    DOB: June 01, 1961, 56 y.o.   MRN: [id number]  HPI Here for 2 weeks of pain in the right hand. This feels much better today after taking Ketoprofen. No recent trauma.   Review of Systems  Constitutional: Negative.   Respiratory: Negative.   Cardiovascular: Negative.   Musculoskeletal: Positive for arthralgias.       Objective:   Physical Exam  Constitutional: She is oriented to person, place, and time. She appears well-developed and well-nourished.  Cardiovascular: Normal rate, regular rhythm, normal heart sounds and intact distal pulses.   Pulmonary/Chest: Effort normal and breath sounds normal. No respiratory distress. She has no wheezes. She has no rales.  Musculoskeletal:  She is tender over the right thenar eminence, full ROM, no swelling or ecchymosis   Neurological: She is alert and oriented to person, place, and time.          Assessment & Plan:  Hand pain, apparently a contusion of some sort. It seems to be healing so she can follow up prn.  Alysia Penna, MD

## 2017-03-02 NOTE — Patient Instructions (Signed)
WE NOW OFFER   [facility] FAST TRACK!!!  SAME DAY Appointments for ACUTE CARE  Such as: Sprains, Injuries, cuts, abrasions, rashes, muscle pain, joint pain, back pain Colds, flu, sore throats, headache, allergies, cough, fever  Ear pain, sinus and eye infections Abdominal pain, nausea, vomiting, diarrhea, upset stomach Animal/insect bites  3 Easy Ways to Schedule: Walk-In Scheduling Call in scheduling Mychart Sign-up: [email or web address]         

## 2017-03-26 ENCOUNTER — Telehealth: Payer: Self-pay | Admitting: Family Medicine

## 2017-03-26 NOTE — Telephone Encounter (Signed)
Pt would like another medication diclofenac for arthritis in knees. [facility] in [place]

## 2017-03-29 NOTE — Telephone Encounter (Signed)
Call in Diclofenac 75 mg bid #60 with 11 rf

## 2017-03-30 MED ORDER — DICLOFENAC SODIUM 75 MG PO TBEC: 75.0000 mg | DELAYED_RELEASE_TABLET | Freq: Two times a day (BID) | ORAL | 11 refills | Status: DC

## 2017-03-30 NOTE — Telephone Encounter (Signed)
I sent script e-scribe to [facility] and spoke with pt  

## 2017-05-27 ENCOUNTER — Encounter: Payer: Self-pay | Admitting: Internal Medicine

## 2017-06-03 ENCOUNTER — Encounter: Payer: Self-pay | Admitting: Internal Medicine

## 2017-06-26 IMAGING — MG MM DIGITAL DIAGNOSTIC BILAT CAD
6 series · 6 of 6 positions shown · non-contrast
Comparison: Previous exam(s).

ADDENDUM:
Ultrasound examination of the right axilla demonstrates no evidence
of lymphadenopathy.
CLINICAL DATA: Follow-up of probably benign left breast
calcifications.

EXAM:
DIGITAL DIAGNOSTIC BILATERAL MAMMOGRAM WITH CAD
ULTRASOUND BILATERAL BREAST

[L CC (1 of 2)]
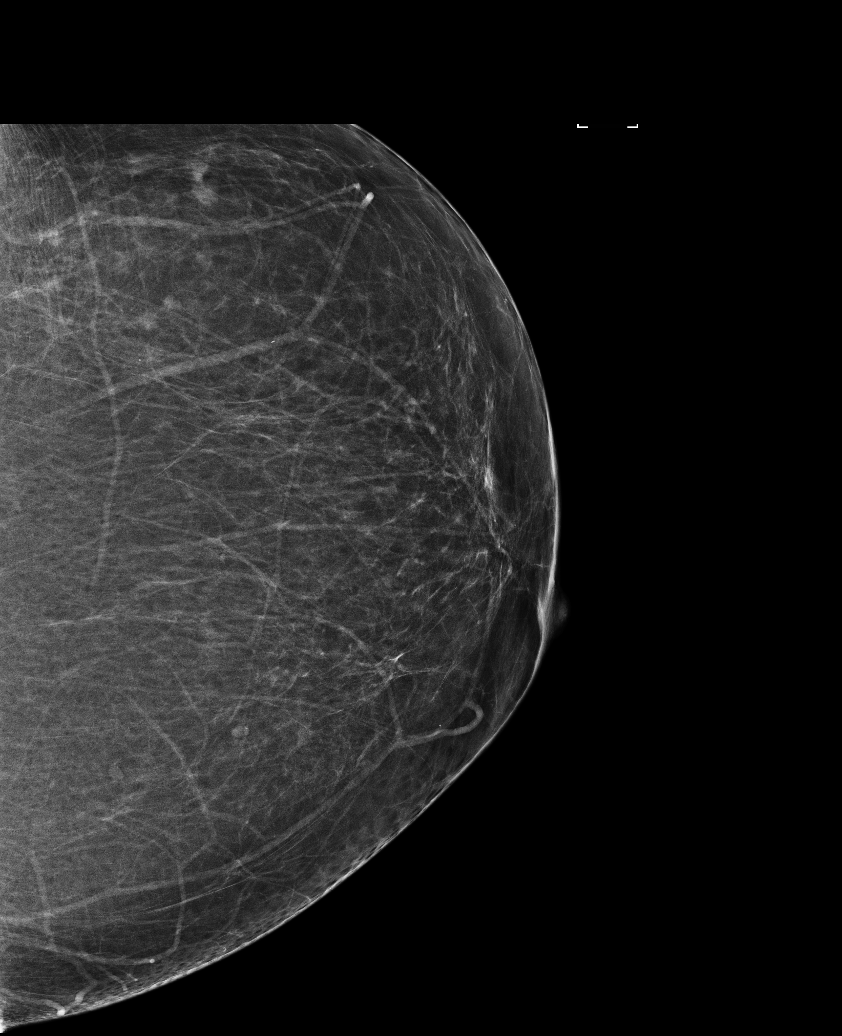

[R CC]
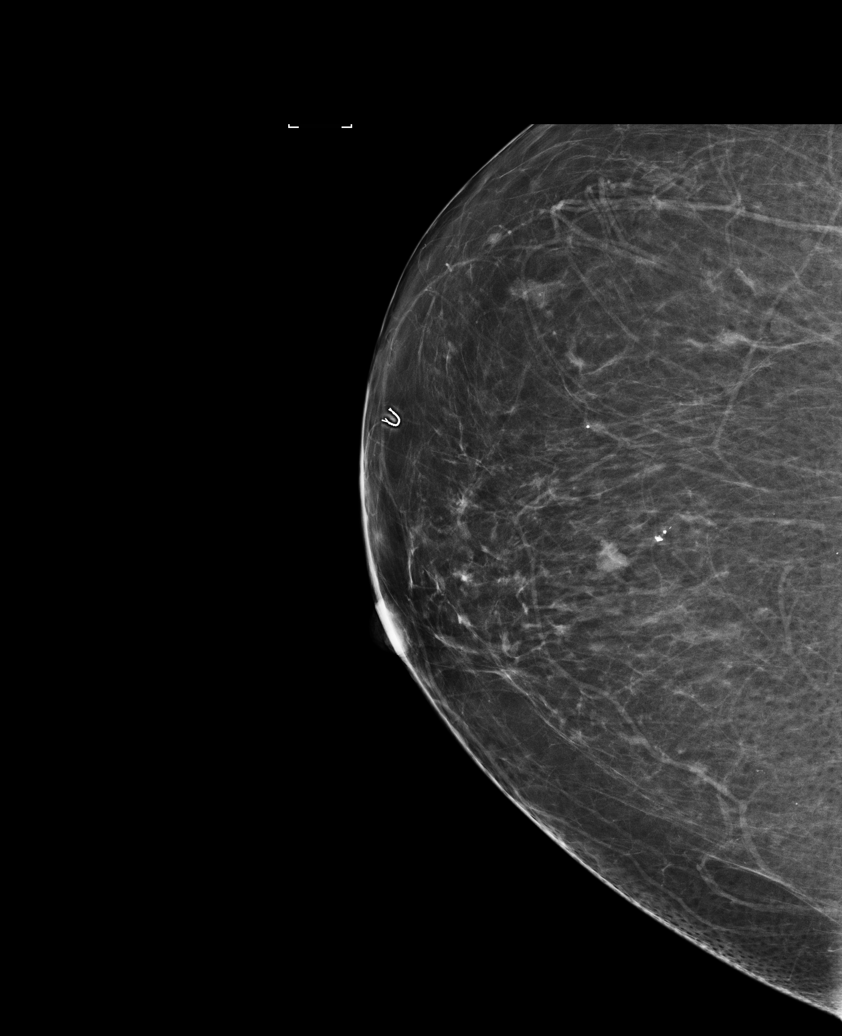

[R MLO]
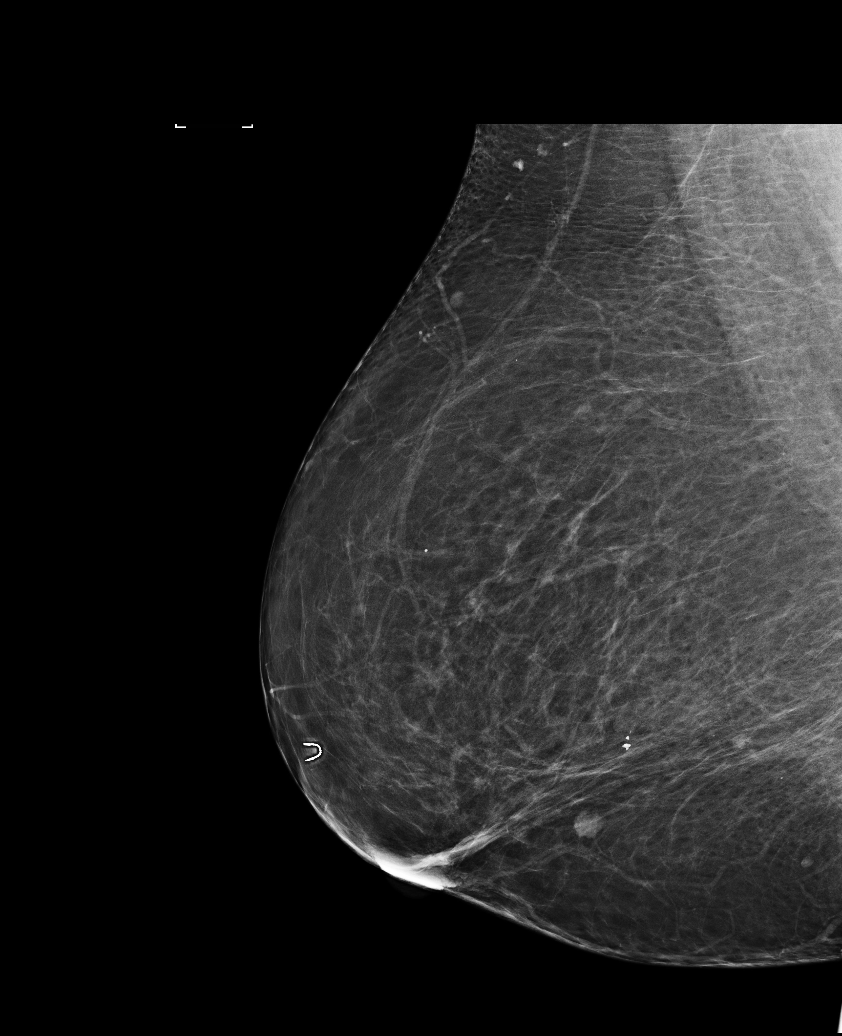

[L ML]
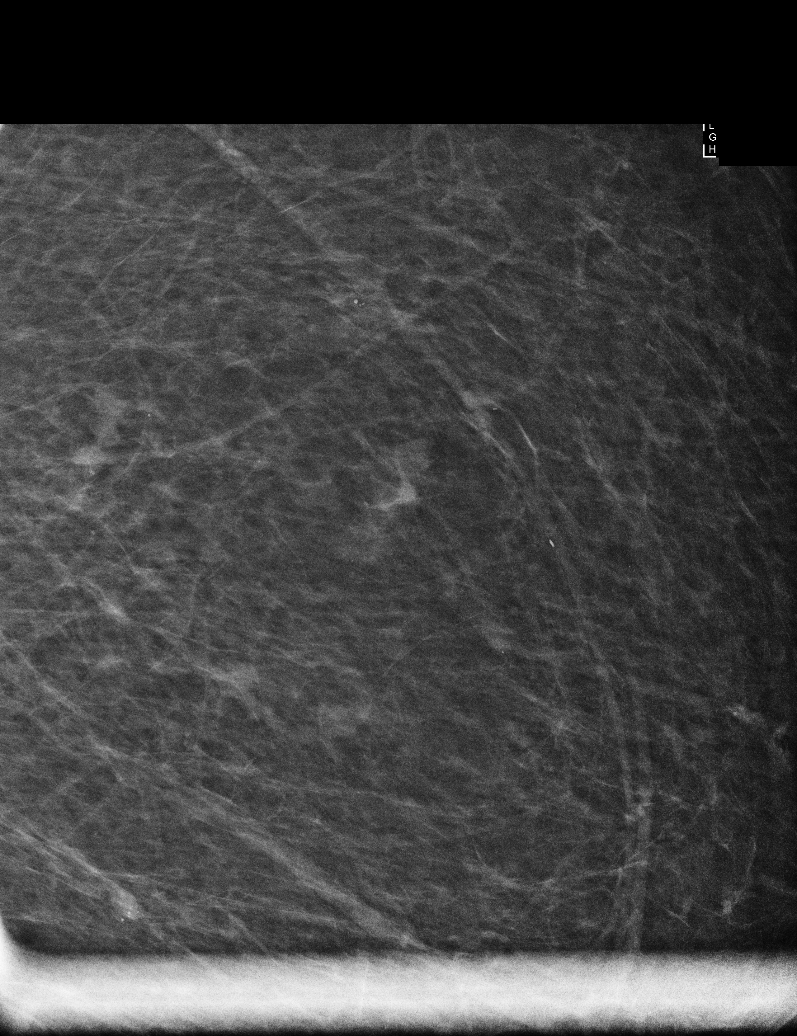

[L MLO]
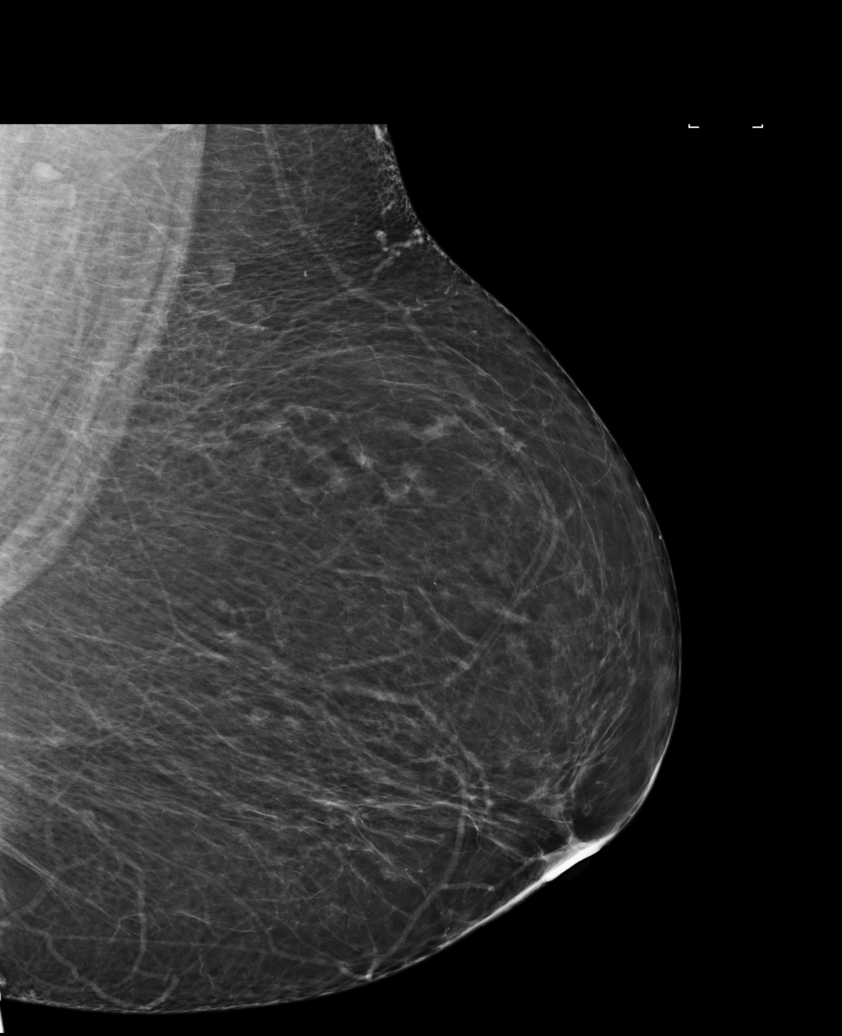

[L CC (2 of 2)]
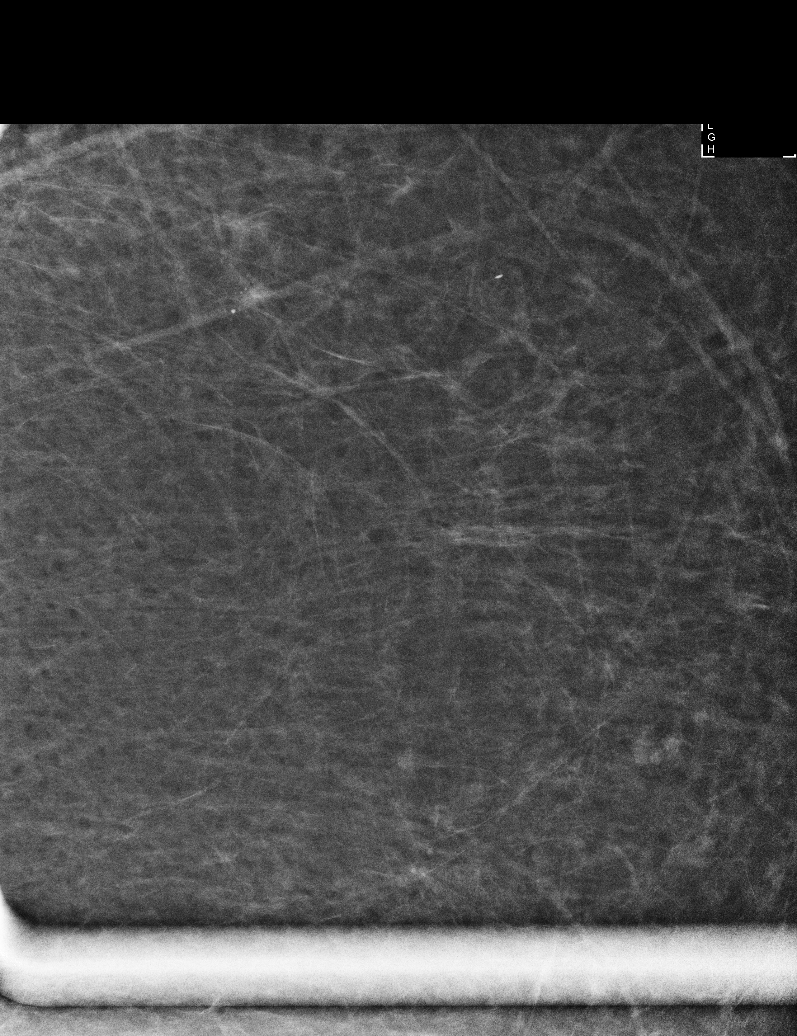

[6 of 6 positions shown; findings below may reference images not displayed]

ACR Breast Density Category b: There are scattered areas of
fibroglandular density.
FINDINGS: Mammographically, there is a new mostly circumscribed nodule in the
right lower slightly outer breast, middle depth. Post biopsy
metallic marker is seen in the right breast upper outer quadrant,
anterior depth. The previously seen scattered calcifications in the
left breast upper outer quadrant, middle depth, demonstrate stable
benign-appearing features. No suspicious masses or areas of
architectural distortion are seen in the left breast.

Mammographic images were processed with CAD.

On physical exam, no suspicious masses are found in either breast.

Targeted ultrasound of the right breast is performed, showing 7
o'clock 3 cm from the nipple hypoechoic slightly irregular nodule
measuring 0.8 by 0.6 by 0.6 cm. This is felt to correspond to the
mammographically seen nodule.

Targeted ultrasound of the left breast upper outer quadrant
demonstrates no suspicious masses or shadowing lesions.
IMPRESSION: Right breast 7 o'clock 8 mm nodule, for which ultrasound-guided core
needle biopsy is recommended.

Probably benign stable loosely grouped calcifications in the left
breast upper outer quadrant, middle depth, for which six-month
follow-up is recommended.

RECOMMENDATION:
Ultrasound-guided core needle biopsy of the right breast.

Diagnostic mammogram of the left breast in 6 months. (Code:[id number])

I have discussed the findings and recommendations with the patient.
Results were also provided in writing at the conclusion of the
visit. If applicable, a reminder letter will be sent to the patient
regarding the next appointment.

BI-RADS CATEGORY  4: Suspicious.

## 2017-07-06 ENCOUNTER — Encounter: Payer: BLUE CROSS/BLUE SHIELD | Admitting: Family Medicine

## 2017-07-07 ENCOUNTER — Telehealth: Payer: Self-pay | Admitting: *Deleted

## 2017-07-07 NOTE — Telephone Encounter (Signed)
Dr Carlean Purl,  This pt has a PV for 12/31 for  a recall colon with you on 1-14 Monday in the [facility] . She has a recent BMI of 51.1.  She has a hx of SSA and TA at last colon. Do you want an OV or do you want her to be direct at [facility].  Please advise, Thanks for your time, Marijean Niemann

## 2017-07-07 NOTE — Telephone Encounter (Signed)
Direct at hospital

## 2017-07-07 NOTE — Telephone Encounter (Signed)
Sheri,   can you schedule this for me  Thanks so much  Her PV is 12-31  Lelan Pons

## 2017-07-08 ENCOUNTER — Other Ambulatory Visit: Payer: Self-pay

## 2017-07-08 DIAGNOSIS — Z1211 Encounter for screening for malignant neoplasm of colon: Secondary | ICD-10-CM

## 2017-07-08 NOTE — Telephone Encounter (Signed)
Patient has been scheduled at [facility] on 09/13/17 to arrive at 10:15 for 11:45 appt.

## 2017-07-09 ENCOUNTER — Ambulatory Visit (INDEPENDENT_AMBULATORY_CARE_PROVIDER_SITE_OTHER): Payer: BLUE CROSS/BLUE SHIELD | Admitting: Family Medicine

## 2017-07-09 ENCOUNTER — Encounter: Payer: Self-pay | Admitting: Family Medicine

## 2017-07-09 VITALS — BP 120/80 | HR 92 | Temp 98.1°F | Wt 289.0 lb

## 2017-07-09 DIAGNOSIS — Z0000 Encounter for general adult medical examination without abnormal findings: Secondary | ICD-10-CM

## 2017-07-09 DIAGNOSIS — E1169 Type 2 diabetes mellitus with other specified complication: Secondary | ICD-10-CM

## 2017-07-09 DIAGNOSIS — E669 Obesity, unspecified: Secondary | ICD-10-CM | POA: Diagnosis not present

## 2017-07-09 LAB — BASIC METABOLIC PANEL
BUN: 16 mg/dL (ref 6–23)
CALCIUM: 9.1 mg/dL (ref 8.4–10.5)
CO2: 25 mEq/L (ref 19–32)
CREATININE: 0.67 mg/dL (ref 0.40–1.20)
Chloride: 103 mEq/L (ref 96–112)
GFR: 95.91 mL/min (ref >60.00)
GLUCOSE: 122 mg/dL — AB (ref 70–99)
Potassium: 4 mEq/L (ref 3.5–5.1)
SODIUM: 138 meq/L (ref 135–145)

## 2017-07-09 LAB — POC URINALSYSI DIPSTICK (AUTOMATED)
Bilirubin, UA: NEGATIVE
Glucose, UA: NEGATIVE
Ketones, UA: NEGATIVE
Leukocytes, UA: NEGATIVE
NITRITE UA: NEGATIVE
PH UA: 6 (ref 5.0–8.0)
PROTEIN UA: NEGATIVE
SPEC GRAV UA: 1.02 (ref 1.010–1.025)
UROBILINOGEN UA: 0.2 U/dL

## 2017-07-09 LAB — CBC WITH DIFFERENTIAL/PLATELET
BASOS ABS: 0.1 10*3/uL (ref 0.0–0.1)
Basophils Relative: 0.8 % (ref 0.0–3.0)
EOS ABS: 0.2 10*3/uL (ref 0.0–0.7)
Eosinophils Relative: 2 % (ref 0.0–5.0)
HEMATOCRIT: 41.6 % (ref 36.0–46.0)
HEMOGLOBIN: 13.9 g/dL (ref 12.0–15.0)
LYMPHS PCT: 27.9 % (ref 12.0–46.0)
Lymphs Abs: 2.2 10*3/uL (ref 0.7–4.0)
MCHC: 33.5 g/dL (ref 30.0–36.0)
MCV: 88.3 fl (ref 78.0–100.0)
MONO ABS: 0.6 10*3/uL (ref 0.1–1.0)
Monocytes Relative: 7.1 % (ref 3.0–12.0)
Neutro Abs: 4.9 10*3/uL (ref 1.4–7.7)
Neutrophils Relative %: 62.2 % (ref 43.0–77.0)
PLATELETS: 280 10*3/uL (ref 150.0–400.0)
RBC: 4.71 Mil/uL (ref 3.87–5.11)
RDW: 13.6 % (ref 11.5–15.5)
WBC: 7.8 10*3/uL (ref 4.0–10.5)

## 2017-07-09 LAB — HEMOGLOBIN A1C: HEMOGLOBIN A1C: 6.9 % — AB (ref 4.6–6.5)

## 2017-07-09 LAB — LIPID PANEL
CHOLESTEROL: 189 mg/dL (ref 0–200)
HDL: 44.6 mg/dL (ref >39.00)
LDL CALC: 109 mg/dL — AB (ref 0–99)
NonHDL: 144.17
Total CHOL/HDL Ratio: 4
Triglycerides: 178 mg/dL — ABNORMAL HIGH (ref 0.0–149.0)
VLDL: 35.6 mg/dL (ref 0.0–40.0)

## 2017-07-09 LAB — HEPATIC FUNCTION PANEL
ALBUMIN: 3.9 g/dL (ref 3.5–5.2)
ALK PHOS: 65 U/L (ref 39–117)
ALT: 30 U/L (ref 0–35)
AST: 25 U/L (ref 0–37)
Bilirubin, Direct: 0.1 mg/dL (ref 0.0–0.3)
TOTAL PROTEIN: 6.7 g/dL (ref 6.0–8.3)
Total Bilirubin: 0.7 mg/dL (ref 0.2–1.2)

## 2017-07-09 LAB — TSH: TSH: 2.36 u[IU]/mL (ref 0.35–4.50)

## 2017-07-09 MED ORDER — SIMVASTATIN 40 MG PO TABS: ORAL_TABLET | ORAL | 3 refills | Status: DC

## 2017-07-09 MED ORDER — LOSARTAN POTASSIUM-HCTZ 50-12.5 MG PO TABS: 1.0000 | ORAL_TABLET | Freq: Every day | ORAL | 3 refills | Status: DC

## 2017-07-09 NOTE — Progress Notes (Signed)
   Subjective:    Patient ID: Teresa Dawson, female    DOB: [birth year], 56 y.o.   MRN: [id number]  HPI Here for a well exam. She feels fine but does mention a frequent dry cough that started over a year ago. No SOB. Her BP has been stable.    Review of Systems  Constitutional: Negative.   HENT: Negative.   Eyes: Negative.   Respiratory: Positive for cough. Negative for apnea, choking, chest tightness, shortness of breath, wheezing and stridor.   Cardiovascular: Negative.   Gastrointestinal: Negative.   Genitourinary: Negative for decreased urine volume, difficulty urinating, dyspareunia, dysuria, enuresis, flank pain, frequency, hematuria, pelvic pain and urgency.  Musculoskeletal: Negative.   Skin: Negative.   Neurological: Negative.   Psychiatric/Behavioral: Negative.        Objective:   Physical Exam  Constitutional: She is oriented to person, place, and time. No distress.  Morbidly obese   HENT:  Head: Normocephalic and atraumatic.  Right Ear: External ear normal.  Left Ear: External ear normal.  Nose: Nose normal.  Mouth/Throat: Oropharynx is clear and moist. No oropharyngeal exudate.  Eyes: Conjunctivae and EOM are normal. Pupils are equal, round, and reactive to light. No scleral icterus.  Neck: Normal range of motion. Neck supple. No JVD present. No thyromegaly present.  Cardiovascular: Normal rate, regular rhythm, normal heart sounds and intact distal pulses. Exam reveals no gallop and no friction rub.  No murmur heard. Pulmonary/Chest: Effort normal and breath sounds normal. No respiratory distress. She has no wheezes. She has no rales. She exhibits no tenderness.  Abdominal: Soft. Bowel sounds are normal. She exhibits no distension and no mass. There is no tenderness. There is no rebound and no guarding.  Musculoskeletal: Normal range of motion. She exhibits no edema or tenderness.  Lymphadenopathy:    She has no cervical adenopathy.  Neurological: She is alert  and oriented to person, place, and time. She has normal reflexes. No cranial nerve deficit. She exhibits normal muscle tone. Coordination normal.  Skin: Skin is warm and dry. No rash noted. No erythema.  Psychiatric: She has a normal mood and affect. Her behavior is normal. Judgment and thought content normal.          Assessment & Plan:  Well exam. We discussed diet and exercise. Get fasting labs. I think her cough is a side effect of her ACE inhibitor, so we will stop the Lisinopril HCT and substitute Losartan HCT 50-12.5 daily. She is scheduled for a colonoscopy on 08-02-17. Alysia Penna, MD

## 2017-07-14 ENCOUNTER — Telehealth: Payer: Self-pay | Admitting: Family Medicine

## 2017-07-14 NOTE — Telephone Encounter (Signed)
I spoke with pt, she is requesting a copy of recent labs and also just wants to make sure it will show the year before. Also script for Losartan-HCTZ is on back order per pharmacy. Please advise?

## 2017-07-15 NOTE — Telephone Encounter (Signed)
Called and spoke with the pt. I advised her that yes her lab results paper work that I am getting ready to mail will have her results on there from a year before.   Pt stated that she did have some concerns with her A1c last year her A1c was 6.6 and this year it was 6.9 should she be concern?   I asked the pt if she would like for [place] to resend her Rx for losartan-HCTZ to another pharmacy or she could contact her pharmacy [facility] and see if they can transfer this Rx to another local [facility] that has this medication in stock. Advised the pt to call back in regards to what she would like to do.   Sent to PCP for pt's A1c concern.

## 2017-07-19 NOTE — Telephone Encounter (Signed)
As for the A1c, as long as it stays below 7 she is doing well. Just watch a strict diet. For the meds, we can separate the 2 parts. Call in Losartan 50 mg daily, #90 with 3 rf and also HCYT 12.5 mg daily, #90 with 3 rf

## 2017-07-19 NOTE — Telephone Encounter (Signed)
Called and spoke with pt. Pt advised and voice understanding. Pt stated that her pharmacy called to advise her they could refill her current Losartan-HCTZ. Do NOT need to fill sperate scripts at this time. Pt wanted Dr. Sarajane Jews to know as an [place] she rescheduled her colonoscopy to Feb 28.2019

## 2017-08-02 ENCOUNTER — Encounter: Payer: BLUE CROSS/BLUE SHIELD | Admitting: Internal Medicine

## 2017-08-25 ENCOUNTER — Encounter: Payer: Self-pay | Admitting: Internal Medicine

## 2017-09-02 ENCOUNTER — Encounter: Payer: Self-pay | Admitting: Family Medicine

## 2017-09-02 ENCOUNTER — Ambulatory Visit: Payer: BLUE CROSS/BLUE SHIELD | Admitting: Family Medicine

## 2017-09-02 VITALS — BP 146/88 | HR 100 | Temp 97.9°F | Ht 64.0 in | Wt 292.0 lb

## 2017-09-02 DIAGNOSIS — M79644 Pain in right finger(s): Secondary | ICD-10-CM | POA: Diagnosis not present

## 2017-09-02 DIAGNOSIS — R040 Epistaxis: Secondary | ICD-10-CM | POA: Diagnosis not present

## 2017-09-02 DIAGNOSIS — J209 Acute bronchitis, unspecified: Secondary | ICD-10-CM

## 2017-09-02 MED ORDER — HYDROCODONE-HOMATROPINE 5-1.5 MG/5ML PO SYRP: 5.0000 mL | ORAL_SOLUTION | ORAL | 0 refills | Status: DC | PRN

## 2017-09-02 MED ORDER — INDOMETHACIN 50 MG PO CAPS: 50.0000 mg | ORAL_CAPSULE | Freq: Three times a day (TID) | ORAL | 5 refills | Status: DC | PRN

## 2017-09-02 MED ORDER — AZITHROMYCIN 250 MG PO TABS: ORAL_TABLET | ORAL | 0 refills | Status: DC

## 2017-09-02 NOTE — Progress Notes (Signed)
   Subjective:    Patient ID: Teresa Dawson, female    DOB: January 12, 1961, 57 y.o.   MRN: [id number]  HPI Here for several issues. First she has had frequent nose bleeds from the right nostril in the past month. There is no soreness. No other bruising or bleeding problems. She has stopped taking daily aspirin but this has not helped. Second for one month she has had pain at the base of the right thumb. No hx of trauma but she does spend a lot of time on her computer using a mouse. She has tried Diclofenac with little relief. Third she has had 3 days of chest tightness and coughing up yellow sputum. No fever. Delsym has not helped.   Review of Systems  Constitutional: Negative.   HENT: Positive for nosebleeds.   Eyes: Negative.   Respiratory: Positive for cough and chest tightness.   Cardiovascular: Negative.   Musculoskeletal: Positive for arthralgias.       Objective:   Physical Exam  Constitutional: She appears well-developed and well-nourished.  HENT:  Right Ear: External ear normal.  Left Ear: External ear normal.  Mouth/Throat: Oropharynx is clear and moist.  Right anterior nostril has a small blood clot in place   Eyes: Conjunctivae are normal.  Neck: No thyromegaly present.  Pulmonary/Chest: Effort normal. No respiratory distress. She has no wheezes. She has no rales.  Scattered rhonchi   Musculoskeletal:  The right first [facility] joint is tender but not swollen   Lymphadenopathy:    She has no cervical adenopathy.          Assessment & Plan:  For the nosebleeds she will use a cool mist vaporizer by her bedside at night. Use saline nasal sprays qid. For the bronchitis, she will take a Zpack. She has a little arthritis in the thumb, so she will rest it, apply ice, and use Indocin prn.  Alysia Penna, MD

## 2017-09-07 ENCOUNTER — Ambulatory Visit: Payer: BLUE CROSS/BLUE SHIELD | Admitting: Family Medicine

## 2017-09-08 ENCOUNTER — Ambulatory Visit: Payer: BLUE CROSS/BLUE SHIELD | Admitting: Family Medicine

## 2017-09-08 ENCOUNTER — Encounter: Payer: Self-pay | Admitting: Family Medicine

## 2017-09-08 VITALS — BP 138/76 | HR 97 | Temp 98.3°F | Wt 288.2 lb

## 2017-09-08 DIAGNOSIS — J209 Acute bronchitis, unspecified: Secondary | ICD-10-CM | POA: Diagnosis not present

## 2017-09-08 MED ORDER — METHYLPREDNISOLONE 4 MG PO TBPK: ORAL_TABLET | ORAL | 1 refills | Status: DC

## 2017-09-08 MED ORDER — HYDROCOD POLST-CPM POLST ER 10-8 MG/5ML PO SUER: 5.0000 mL | Freq: Two times a day (BID) | ORAL | 0 refills | Status: DC | PRN

## 2017-09-08 MED ORDER — LEVOFLOXACIN 500 MG PO TABS: 500.0000 mg | ORAL_TABLET | Freq: Every day | ORAL | 0 refills | Status: DC

## 2017-09-08 NOTE — Progress Notes (Signed)
   Subjective:    Patient ID: Teresa Dawson, female    DOB: 1961/01/12, 57 y.o.   MRN: [id number]  HPI Here for one week of chest tightness and a dry cough that has not responded at all to a Zpack and Hydromet syrup. The cough is worse than ever and she cannot sleep at all. No fever.    Review of Systems  Constitutional: Negative.   HENT: Negative.   Eyes: Negative.   Respiratory: Positive for cough, chest tightness and wheezing. Negative for shortness of breath.   Cardiovascular: Negative.        Objective:   Physical Exam  Constitutional: She appears well-developed and well-nourished.  HENT:  Right Ear: External ear normal.  Left Ear: External ear normal.  Nose: Nose normal.  Mouth/Throat: Oropharynx is clear and moist.  Eyes: Conjunctivae are normal.  Neck: No thyromegaly present.  Cardiovascular: Normal rate, regular rhythm, normal heart sounds and intact distal pulses.  Pulmonary/Chest: Effort normal. No respiratory distress. She has no rales.  Diffuse wheezes and rhonchi   Lymphadenopathy:    She has no cervical adenopathy.          Assessment & Plan:  Bronchitis, treat with Levaquin and a Medrol dose pack. [place] e Tussionex for cough.  Alysia Penna, MD

## 2017-09-13 ENCOUNTER — Encounter (HOSPITAL_COMMUNITY): Payer: Self-pay

## 2017-09-13 ENCOUNTER — Ambulatory Visit (HOSPITAL_COMMUNITY): Admit: 2017-09-13 | Payer: BLUE CROSS/BLUE SHIELD | Admitting: Internal Medicine

## 2017-09-13 SURGERY — COLONOSCOPY WITH PROPOFOL
Anesthesia: Monitor Anesthesia Care

## 2017-09-16 ENCOUNTER — Encounter: Payer: BLUE CROSS/BLUE SHIELD | Admitting: Internal Medicine

## 2017-09-16 ENCOUNTER — Telehealth: Payer: Self-pay | Admitting: Family Medicine

## 2017-09-16 MED ORDER — HYDROCOD POLST-CPM POLST ER 10-8 MG/5ML PO SUER: 5.0000 mL | Freq: Two times a day (BID) | ORAL | 0 refills | Status: DC | PRN

## 2017-09-16 NOTE — Telephone Encounter (Signed)
See request    Thank you

## 2017-09-16 NOTE — Telephone Encounter (Signed)
Please Advise, see the note in chart

## 2017-09-16 NOTE — Telephone Encounter (Signed)
This was sent in to her pharmacy  

## 2017-09-16 NOTE — Telephone Encounter (Signed)
Copied from [place]. Topic: Quick Communication - See Telephone Encounter >> Sep 16, 2017  9:27 AM Teresa Dawson wrote: CRM for notification. See Telephone encounter for:   09/16/17.  Pt was seen on 2/20 by Dr Sarajane Jews & he prescribed her a small bottle of chlorpheniramine-HYDROcodone (TUSSIONEX PENNKINETIC ER) 10-8 MG/5ML SURE bc it was so expensive & told her to call back if she felt it worked and needed more. She wants to know if she can have another small bottle. Already contacted pharmacy & they advised her to call her PCP to get a new script.  [place], [place]

## 2017-11-18 ENCOUNTER — Ambulatory Visit (AMBULATORY_SURGERY_CENTER): Payer: Self-pay

## 2017-11-18 VITALS — Ht 64.5 in | Wt 282.7 lb

## 2017-11-18 DIAGNOSIS — Z86010 Personal history of colonic polyps: Secondary | ICD-10-CM

## 2017-11-18 NOTE — Progress Notes (Signed)
Per pt, no allergies to soy or egg products.Pt not taking any weight loss meds or using  O2 at home.   During PV, pt got upset when instructed to have a driver the day of the procedure. Pt states she did not have a driver!Marland Kitchen  A list of names with a phone number was provided to the pt regarding assistance with transportation. She states it was not very helpful!     Emmi video sent to pt's email

## 2017-11-24 ENCOUNTER — Encounter: Payer: Self-pay | Admitting: Internal Medicine

## 2017-11-26 ENCOUNTER — Telehealth: Payer: Self-pay | Admitting: Internal Medicine

## 2017-11-26 NOTE — Telephone Encounter (Signed)
Returned patients call. Patient was informed that  [place] guidelines, are as follows, maximum weight is 350 pounds and a BMI of 50 or above. Patient was quite upset that she had been scheduled for a hospital procedure back in December 2018 and now she is being scheduled at the [facility]. I explained to the patient that it is possible to have a BMI that does not meet criteria and still be within the weight limit. Once this was explained to her she seemed to have a better understanding. Her current weight is 282 and her BMI is 47.78.   Riki Sheer, LPN ( Admitting )

## 2017-11-30 ENCOUNTER — Encounter: Payer: Self-pay | Admitting: Internal Medicine

## 2017-11-30 ENCOUNTER — Ambulatory Visit (AMBULATORY_SURGERY_CENTER): Payer: BLUE CROSS/BLUE SHIELD | Admitting: Internal Medicine

## 2017-11-30 ENCOUNTER — Other Ambulatory Visit: Payer: Self-pay

## 2017-11-30 VITALS — BP 148/58 | HR 82 | Temp 97.1°F | Resp 13 | Ht 64.5 in | Wt 282.0 lb

## 2017-11-30 DIAGNOSIS — Z860100 Personal history of colon polyps, unspecified: Secondary | ICD-10-CM

## 2017-11-30 MED ORDER — SODIUM CHLORIDE 0.9 % IV SOLN: 500.0000 mL | Freq: Once | INTRAVENOUS | Status: DC

## 2017-11-30 NOTE — Op Note (Signed)
[facility] Patient Name: Teresa Dawson Procedure Date: 11/30/2017 10:14 AM MRN: [id number] Endoscopist: Gatha Mayer , MD Age: 57 Referring MD:  Date of Birth: 05-06-1961 Gender: Female Account #: [id number] Procedure:                Colonoscopy Indications:              High risk colon cancer surveillance: Personal                            history of colonic polyps Medicines:                Propofol per Anesthesia, Monitored Anesthesia Care Procedure:                Pre-Anesthesia Assessment:                           - Prior to the procedure, a History and Physical                            was performed, and patient medications and                            allergies were reviewed. The patient's tolerance of                            previous anesthesia was also reviewed. The risks                            and benefits of the procedure and the sedation                            options and risks were discussed with the patient.                            All questions were answered, and informed consent                            was obtained. Prior Anticoagulants: The patient has                            taken no previous anticoagulant or antiplatelet                            agents. ASA Grade Assessment: III - A patient with                            severe systemic disease. After reviewing the risks                            and benefits, the patient was deemed in                            satisfactory condition to undergo the procedure.  After obtaining informed consent, the colonoscope                            was passed under direct vision. Throughout the                            procedure, the patient's blood pressure, pulse, and                            oxygen saturations were monitored continuously. The                            Colonoscope was introduced through the anus and                            advanced to  the the cecum, identified by                            appendiceal orifice and ileocecal valve. The                            quality of the bowel preparation was excellent. The                            colonoscopy was performed without difficulty. The                            patient tolerated the procedure well. The bowel                            preparation used was Miralax. Scope In: 10:30:24 AM Scope Out: 10:43:05 AM Scope Withdrawal Time: 0 hours 9 minutes 48 seconds  Total Procedure Duration: 0 hours 12 minutes 41 seconds  Findings:                 The perianal and digital rectal examinations were                            normal.                           The colon (entire examined portion) appeared normal.                           No additional abnormalities were found on                            retroflexion. Complications:            No immediate complications. Estimated blood loss:                            None. Estimated Blood Loss:     Estimated blood loss: none. Impression:               - The entire examined colon is normal.                           -  No specimens collected.                           - Personal history of colonic polyps. 2015 - 5                            polyps - 4 adenomas 1 ssp Recommendation:           - Repeat colonoscopy in 5 years for surveillance.                           - Patient has a contact number available for                            emergencies. The signs and symptoms of potential                            delayed complications were discussed with the                            patient. Return to normal activities tomorrow.                            Written discharge instructions were provided to the                            patient.                           - Resume previous diet.                           - Continue present medications. Gatha Mayer, MD 11/30/2017 10:47:46 AM This report has been signed  electronically.

## 2017-11-30 NOTE — Progress Notes (Signed)
Pt's states no medical or surgical changes since previsit or office visit. 

## 2017-11-30 NOTE — Patient Instructions (Addendum)
No polyps today!  Your next routine colonoscopy should be in 5 years - 2024.  I appreciate the opportunity to care for you. Gatha Mayer, MD, FACG YOU HAD AN ENDOSCOPIC PROCEDURE TODAY AT [facility]:   Refer to the procedure report that was given to you for any specific questions about what was found during the examination.  If the procedure report does not answer your questions, please call your gastroenterologist to clarify.  If you requested that your care partner not be given the details of your procedure findings, then the procedure report has been included in a sealed envelope for you to review at your convenience later.  YOU SHOULD EXPECT: Some feelings of bloating in the abdomen. Passage of more gas than usual.  Walking can help get rid of the air that was put into your GI tract during the procedure and reduce the bloating. If you had a lower endoscopy (such as a colonoscopy or flexible sigmoidoscopy) you may notice spotting of blood in your stool or on the toilet paper. If you underwent a bowel prep for your procedure, you may not have a normal bowel movement for a few days.  Please Note:  You might notice some irritation and congestion in your nose or some drainage.  This is from the oxygen used during your procedure.  There is no need for concern and it should clear up in a day or so.  SYMPTOMS TO REPORT IMMEDIATELY:   Following lower endoscopy (colonoscopy or flexible sigmoidoscopy):  Excessive amounts of blood in the stool  Significant tenderness or worsening of abdominal pains  Swelling of the abdomen that is new, acute  Fever of 100F or higher   Following upper endoscopy (EGD)  Vomiting of blood or coffee ground material  New chest pain or pain under the shoulder blades  Painful or persistently difficult swallowing  New shortness of breath  Fever of 100F or higher  Black, tarry-looking stools  For urgent or emergent issues, a  gastroenterologist can be reached at any hour by calling [phone].   DIET:  We do recommend a small meal at first, but then you may proceed to your regular diet.  Drink plenty of fluids but you should avoid alcoholic beverages for 24 hours.  ACTIVITY:  You should plan to take it easy for the rest of today and you should NOT DRIVE or use heavy machinery until tomorrow (because of the sedation medicines used during the test).    FOLLOW UP: Our staff will call the number listed on your records the next business day following your procedure to check on you and address any questions or concerns that you may have regarding the information given to you following your procedure. If we do not reach you, we will leave a message.  However, if you are feeling well and you are not experiencing any problems, there is no need to return our call.  We will assume that you have returned to your regular daily activities without incident.  If any biopsies were taken you will be contacted by phone or by letter within the next 1-3 weeks.  Please call us at [phone] if you have not heard about the biopsies in 3 weeks.    SIGNATURES/CONFIDENTIALITY: You and/or your care partner have signed paperwork which will be entered into your electronic medical record.  These signatures attest to the fact that that the information above on your After Visit Summary has been reviewed and  is understood.  Full responsibility of the confidentiality of this discharge information lies with you and/or your care-partner.  Recall 5 years Your blood sugar was 129 in the recovery room.

## 2017-11-30 NOTE — Progress Notes (Signed)
Report to PACU, RN, vss, BBS= Clear.  

## 2017-12-01 ENCOUNTER — Telehealth: Payer: Self-pay

## 2017-12-01 NOTE — Telephone Encounter (Signed)
  Follow up Call-  Call back number 11/30/2017  Post procedure Call Back phone  # [phone]  Permission to leave phone message Yes  Some recent data might be hidden     Patient questions:  Do you have a fever, pain , or abdominal swelling? No. Pain Score  0 *  Have you tolerated food without any problems? No. Ate lettuce when she left yesterday, then a large milkshake.  Immediately started frequent bowel movements again.  Wanted to know how to get rid of trapped air.  RN suggested walking and sipping on warm liquid. Have you been able to return to your normal activities? Yes.    Do you have any questions about your discharge instructions: Diet   Yes.   See above comments. Medications  No. Follow up visit  No.  Do you have questions or concerns about your Care? No.  Actions: * If pain score is 4 or above: No action needed, pain <4.

## 2018-01-14 ENCOUNTER — Telehealth: Payer: Self-pay

## 2018-01-14 DIAGNOSIS — E669 Obesity, unspecified: Principal | ICD-10-CM

## 2018-01-14 DIAGNOSIS — E1169 Type 2 diabetes mellitus with other specified complication: Secondary | ICD-10-CM

## 2018-01-14 NOTE — Telephone Encounter (Signed)
Sent to PCP for the OK to place orders in to recheck A1c   A1c last done 07/09/2017

## 2018-01-14 NOTE — Telephone Encounter (Signed)
Copied from [place] [id number] [id number]. Topic: Quick Communication - See Telephone Encounter >> Jan 14, 2018 11:09 AM Hewitt Shorts wrote: Pt is requesting that an order to have her A!C tested be put in so that she can come in on 02/07/18  Best number [phone]

## 2018-01-17 NOTE — Telephone Encounter (Signed)
The order was placed.

## 2018-01-17 NOTE — Telephone Encounter (Signed)
Called and spoke with pt. Pt advised and scheduled for a lab appt.

## 2018-02-07 ENCOUNTER — Other Ambulatory Visit (INDEPENDENT_AMBULATORY_CARE_PROVIDER_SITE_OTHER): Payer: BLUE CROSS/BLUE SHIELD

## 2018-02-07 DIAGNOSIS — E669 Obesity, unspecified: Secondary | ICD-10-CM

## 2018-02-07 DIAGNOSIS — E1169 Type 2 diabetes mellitus with other specified complication: Secondary | ICD-10-CM | POA: Diagnosis not present

## 2018-02-07 LAB — HEMOGLOBIN A1C: HEMOGLOBIN A1C: 6.9 % — AB (ref 4.6–6.5)

## 2018-02-08 ENCOUNTER — Other Ambulatory Visit: Payer: Self-pay

## 2018-02-08 MED ORDER — HYDROCHLOROTHIAZIDE 12.5 MG PO CAPS: 12.5000 mg | ORAL_CAPSULE | Freq: Every day | ORAL | 1 refills | Status: AC

## 2018-02-08 MED ORDER — LOSARTAN POTASSIUM 50 MG PO TABS: 50.0000 mg | ORAL_TABLET | Freq: Every day | ORAL | 1 refills | Status: AC

## 2018-02-08 NOTE — Telephone Encounter (Signed)
Fax from [place] fax [place] stating that losartan-HCTZ is on back order could we send this in septately  Rx's have been sent separately

## 2018-02-08 NOTE — Telephone Encounter (Signed)
Pt advised and Rx has been resent

## 2018-03-29 ENCOUNTER — Telehealth: Payer: Self-pay | Admitting: Family Medicine

## 2018-03-29 NOTE — Telephone Encounter (Signed)
Duplicated

## 2018-03-29 NOTE — Telephone Encounter (Signed)
Sent to PCP to advise 

## 2018-03-29 NOTE — Telephone Encounter (Signed)
Copied from [place]. Topic: Quick Communication - Rx Refill/Question >> Mar 29, 2018 12:43 PM Sheppard Coil, [employer] wrote: Medication:  losartan-hydrochlorothiazide (HYZAAR) 50-12.5 MG tablet   Pt states she is beginning to have swelling in both feet and is currently in [place].  Pt wants to know if Dr. Sarajane Jews would suggest she start taking more of the above medication. Pt can be reached at [phone]

## 2018-03-29 NOTE — Telephone Encounter (Signed)
Copied from [place]. Topic: Quick Communication - Rx Refill/Question >> Mar 29, 2018 12:43 PM Sheppard Coil, [employer] wrote: Medication:  losartan-hydrochlorothiazide (HYZAAR) 50-12.5 MG tablet   Pt states she is beginning to have swelling in both feet and is currently in [place].  Pt wants to know if Dr. Sarajane Jews would suggest she start taking more of the above medication. Pt can be reached at [phone]

## 2018-03-30 MED ORDER — FUROSEMIDE 20 MG PO TABS: 20.0000 mg | ORAL_TABLET | Freq: Every day | ORAL | 0 refills | Status: DC

## 2018-03-30 NOTE — Telephone Encounter (Signed)
Tell her to keep her BP meds the same. Call in Lasix 20 mg to take once daily, #30 with no rf. She should see [place] when she gets back to [facility]

## 2018-03-30 NOTE — Addendum Note (Signed)
Addended by: Myriam Forehand on: 03/30/2018 01:36 PM   Modules accepted: Orders

## 2018-03-30 NOTE — Telephone Encounter (Signed)
Rx has been sent to [facility] in [place]. Pt advised to f/u once she is back in [place].

## 2018-05-06 ENCOUNTER — Telehealth: Payer: Self-pay | Admitting: Family Medicine

## 2018-05-06 NOTE — Telephone Encounter (Signed)
Copied from [place] [id number]. Topic: Quick Communication - Rx Refill/Question >> May 06, 2018  2:22 PM [name] wrote: Medication: simvastatin (ZOCOR) 40 MG tablet   furosemide (LASIX) 20 MG tablet pt would like a higher dose of Lasix to get rid of all of the swelling she don't like to wear the compression socks Pt stays in [place] for a while until Thanksgiving  Has the patient contacted their pharmacy? Yes.   (Agent: If no, request that the patient contact the pharmacy for the refill.) (Agent: If yes, when and what did the pharmacy advise?)  Preferred Pharmacy (with phone number or street name): [place], [place] [phone] (Phone) [phone] (Fax)    Agent: Please be advised that RX refills may take up to 3 business days. We ask that you follow-up with your pharmacy.

## 2018-05-10 MED ORDER — FUROSEMIDE 40 MG PO TABS: 40.0000 mg | ORAL_TABLET | Freq: Every day | ORAL | 2 refills | Status: DC

## 2018-05-10 MED ORDER — SIMVASTATIN 40 MG PO TABS: ORAL_TABLET | ORAL | 2 refills | Status: AC

## 2018-05-10 NOTE — Telephone Encounter (Signed)
Call in Zocor #30 with 2 rf and Lasix 40 mg (a higher dose) #30 with 2 rf

## 2018-05-10 NOTE — Telephone Encounter (Signed)
lmomtcb x 1 for pt to call back to make her aware of meds sent to the pharmacy there in [place]

## 2018-05-11 NOTE — Telephone Encounter (Signed)
lmomtcb x 2 for the pt.  

## 2018-05-12 NOTE — Telephone Encounter (Signed)
Pt called back and states that she has already picked medication up.

## 2018-07-17 ENCOUNTER — Other Ambulatory Visit: Payer: Self-pay | Admitting: Family Medicine

## 2018-07-18 ENCOUNTER — Telehealth: Payer: Self-pay | Admitting: Family Medicine

## 2018-07-18 NOTE — Telephone Encounter (Signed)
Dr. Fry please advise. Thanks  

## 2018-07-18 NOTE — Telephone Encounter (Signed)
Relation to pt: self  Call back number: [phone] Pharmacy: [facility], [place] [phone] (Phone) [phone] (Fax)    Reason for call:  Patient would like her losartan-hydrochlorothiazide (HYZAAR) 50-12.5 MG tablet filled today due to the cost being $0. If medication is filled on the  1st or after patient would have to pay, please advise. Patient scheduled  her physical for 09/06/2018.

## 2018-07-18 NOTE — Telephone Encounter (Signed)
Copied from [place] [id number]. Topic: Quick Communication - See Telephone Encounter >> Jul 18, 2018  1:29 PM Rutherford Nail, [place] wrote: CRM for notification. See Telephone encounter for: 07/18/18. Patient calling and would like to know if Dr Sarajane Jews could prescribe a pain reliever for her knee. States that she no longer sees the orthopedic doctor, but has taken 1 pain reliever a day for years. Would like to know if he could prescribe etodolac 500 MG? If it is possible, would like it sent in today so her pharmacy can fill it by the 1st due to her meeting her deductible for this year and it will be no cost to her. Please advise. [place]

## 2018-07-19 MED ORDER — ETODOLAC 500 MG PO TABS: 500.0000 mg | ORAL_TABLET | Freq: Every day | ORAL | 0 refills | Status: AC

## 2018-07-19 NOTE — Telephone Encounter (Signed)
Called and spoke with pt and she is aware of rx that has been sent to her pharmacy.

## 2018-07-19 NOTE — Telephone Encounter (Signed)
Call in Etodolac 500 mg daily, #90 with no rf

## 2018-09-06 ENCOUNTER — Encounter: Payer: BLUE CROSS/BLUE SHIELD | Admitting: Family Medicine

## 2018-10-28 ENCOUNTER — Telehealth: Payer: Self-pay | Admitting: Family Medicine

## 2018-10-28 NOTE — Telephone Encounter (Signed)
Copied from [place] [phone]. Topic: Quick Communication - Rx Refill/Question >> Oct 28, 2018  1:31 PM Teresa Dawson wrote: Medication: simvastatin (ZOCOR) 40 MG tablet [[id number]] Patient is requesting Dawson 90 day supply so that she only has to come to the pharmacy 1x with [id number]   Has the patient contacted their pharmacy? Yes  (Agent: If no, request that the patient contact the pharmacy for the refill.) (Agent: If yes, when and what did the pharmacy advise?)  Preferred Pharmacy (with phone number or street name): [place], [place] [phone] (Phone) [phone] (Fax)    Agent: Please be advised that RX refills may take up to 3 business days. We ask that you follow-up with your pharmacy.

## 2018-10-31 ENCOUNTER — Encounter: Payer: Self-pay | Admitting: Family Medicine

## 2018-10-31 ENCOUNTER — Other Ambulatory Visit: Payer: Self-pay

## 2018-10-31 ENCOUNTER — Ambulatory Visit (INDEPENDENT_AMBULATORY_CARE_PROVIDER_SITE_OTHER): Payer: Self-pay | Admitting: Family Medicine

## 2018-10-31 DIAGNOSIS — I10 Essential (primary) hypertension: Secondary | ICD-10-CM

## 2018-10-31 NOTE — Telephone Encounter (Signed)
Called and spoke with pt and she is aware of appt this afternoon with Dr. Sarajane Jews.

## 2018-10-31 NOTE — Progress Notes (Signed)
   Subjective:    Patient ID: Teresa Dawson, female    DOB: 04-18-1961, 58 y.o.   MRN: [id number]  HPI I could never reach her on the phone.   Review of Systems     Objective:   Physical Exam        Assessment & Plan:

## 2018-11-01 ENCOUNTER — Telehealth: Payer: Self-pay | Admitting: *Deleted

## 2018-11-01 NOTE — Telephone Encounter (Signed)
Copied from [place] [id number]. Topic: Quick Communication - Appointment Cancellation >> Oct 31, 2018 12:32 PM Teresa Dawson, Helene Kelp D wrote: Patient called to cancel appointment scheduled for 10/31/18. Patient HAS NOT rescheduled their appointment. Patient needs to cancel due to her insurance not covering webex visits per patient. Patient would like a call back. Route to department's PEC pool.   Called and spoke with pt---she stated that her insurance will not cover any of the virtual visits---she stated that she did not want to pay for this appt out of pocket---this is why you could not reach her yesterday.  She stated that she has tried to get set up with a new provider there in [place], but no one Is taking new pts at this time.  She feels that she has enough meds to last until she can see a new provider there.  FYI for Dr. Sarajane Jews

## 2023-01-18 ENCOUNTER — Other Ambulatory Visit: Payer: Self-pay | Admitting: Internal Medicine
